# Patient Record
Sex: Male | Born: 1965 | Race: Black or African American | Hispanic: No | Marital: Married | Smoking: Never smoker
Health system: Southern US, Community
[De-identification: ages and names within clinical notes are randomized; demographics above are authoritative.]

## PROBLEM LIST (undated history)

## (undated) DIAGNOSIS — J40 Bronchitis, not specified as acute or chronic: Secondary | ICD-10-CM

## (undated) DIAGNOSIS — F4320 Adjustment disorder, unspecified: Secondary | ICD-10-CM

## (undated) DIAGNOSIS — F419 Anxiety disorder, unspecified: Secondary | ICD-10-CM

## (undated) HISTORY — DX: Anxiety disorder, unspecified: F41.9

## (undated) HISTORY — PX: NO PAST SURGERIES: SHX2092

---

## 2015-10-18 ENCOUNTER — Encounter (HOSPITAL_BASED_OUTPATIENT_CLINIC_OR_DEPARTMENT_OTHER): Payer: Self-pay | Admitting: *Deleted

## 2015-10-18 ENCOUNTER — Emergency Department (HOSPITAL_BASED_OUTPATIENT_CLINIC_OR_DEPARTMENT_OTHER): Payer: No Typology Code available for payment source

## 2015-10-18 ENCOUNTER — Emergency Department (HOSPITAL_BASED_OUTPATIENT_CLINIC_OR_DEPARTMENT_OTHER)
Admission: EM | Admit: 2015-10-18 | Discharge: 2015-10-18 | Disposition: A | Discharge status: Home / Self Care | Payer: No Typology Code available for payment source | Attending: Emergency Medicine | Admitting: Emergency Medicine

## 2015-10-18 DIAGNOSIS — R062 Wheezing: Secondary | ICD-10-CM | POA: Diagnosis not present

## 2015-10-18 DIAGNOSIS — R05 Cough: Secondary | ICD-10-CM | POA: Insufficient documentation

## 2015-10-18 DIAGNOSIS — R5383 Other fatigue: Secondary | ICD-10-CM | POA: Insufficient documentation

## 2015-10-18 DIAGNOSIS — R52 Pain, unspecified: Secondary | ICD-10-CM

## 2015-10-18 DIAGNOSIS — M791 Myalgia: Secondary | ICD-10-CM | POA: Insufficient documentation

## 2015-10-18 DIAGNOSIS — R509 Fever, unspecified: Secondary | ICD-10-CM | POA: Diagnosis not present

## 2015-10-18 DIAGNOSIS — R059 Cough, unspecified: Secondary | ICD-10-CM

## 2015-10-18 MED ORDER — IPRATROPIUM-ALBUTEROL 0.5-2.5 (3) MG/3ML IN SOLN
3.0000 mL | Freq: Once | RESPIRATORY_TRACT | Status: AC
  Administered 2015-10-18: 3 mL via RESPIRATORY_TRACT
  Filled 2015-10-18: qty 3

## 2015-10-18 MED ORDER — BENZONATATE 100 MG PO CAPS
100.0000 mg | ORAL_CAPSULE | Freq: Once | ORAL | Status: AC
  Administered 2015-10-18: 100 mg via ORAL
  Filled 2015-10-18: qty 1

## 2015-10-18 MED ORDER — IBUPROFEN 800 MG PO TABS: 800.0000 mg | ORAL_TABLET | Freq: Three times a day (TID) | ORAL | Status: DC

## 2015-10-18 MED ORDER — BENZONATATE 100 MG PO CAPS: 100.0000 mg | ORAL_CAPSULE | Freq: Three times a day (TID) | ORAL | Status: DC

## 2015-10-18 NOTE — ED Notes (Signed)
Rx x 2 and note for work given. D/c home with ride

## 2015-10-18 NOTE — ED Notes (Signed)
Patient transported to X-ray 

## 2015-10-18 NOTE — ED Notes (Signed)
PA at bedside.

## 2015-10-18 NOTE — Discharge Instructions (Signed)
You have been seen today for cough, congestion, body aches, and fever. Your imaging showed no abnormalities. Your symptoms are consistent with a viral illness. Viruses do not require antibiotics. Treatment is symptomatic care. Drink plenty of fluids and get plenty of rest. Ibuprofen or Tylenol for pain or fever. Tessalon for cough. Plain Mucinex may help relieve congestion. Warm liquids or Chloraseptic spray may help soothe the sore throat. Follow up with PCP as needed if symptoms continue. Return to ED should symptoms worsen.  RESOURCE GUIDE  Chronic Pain Problems: Contact [facility]  [phone] Patients need to be referred by their primary care doctor.  Insufficient Money for Medicine: Contact United Way:  call "211" or [facility] [phone].  No Primary Care Doctor: - Call [facility]  [phone] - can help you locate a primary care doctor that  accepts your insurance, provides certain services, etc. - Physician Referral Service- [phone]  Agencies that provide inexpensive medical care: - [facility]  [phone] - [facility]  [phone] - [facility]  [phone] - Women's Clinic  [phone] - Planned Parenthood  [phone] [facility]  [phone]  Medicaid-accepting [facility] Providers: - [facility]- [address]  [phone], Mon-Fri 9am-7pm, Sat 9am-1pm - [facility]- [address] [place], [address] [place]  [phone] - [facility]- [address] [place]  [phone] [facility]- [address]  [phone] - Renaye Rakers- [address] [place], [address], [phone]  Only accepts [place] patients after they have their name  applied to their card  Self Pay (no insurance) in [place]: - Sickle Cell Patients: Dr Willey Blade, [facility]  [address] [place],  [phone] - [facility]- [address] [place]  [phone]       [facility]- [address]       -     [facility]- see information above (Speak to [employer] if you do not have insurance)       -  [facility]- [address] [place], [phone]       -  [facility]- [address],  [phone]       -  [facility]- [address], [phone]       -  Dr Julio Sicks-  [address], [address], [place], [phone]       -  [facility]- [address] [phone]       -  [facility]- [address] [phone], also [address], [phone]       -    [facility]- [address] [place], [phone], 1st & 3rd Saturday   every month, 10am-1pm  1) Find a Doctor and Pay Out of Pocket Although you won't have to find out who is covered by your insurance plan, it is a good idea to ask around and get recommendations. You will then need to call the office and see if the doctor you have chosen will accept you as a new patient and what types of options they offer for patients who are self-pay. Some doctors offer discounts or will set up payment plans for their patients who do not have insurance, but  you will need to ask so you aren't surprised when you get to your appointment.  2) Contact Your Local Health Department Not all health departments have doctors that can see patients for sick visits, but many do, so it is worth a call to see if yours does. If you don't know where your local health department is, you can check in your phone book. The CDC also has a tool to help you locate your state's health department, and many state websites also have listings of all of their local health departments.  3) Find a Walk-in Clinic If your illness is not likely to be very severe or complicated, you may want to try a walk in clinic. These are popping up all over the country in pharmacies,  drugstores, and shopping centers. They're usually staffed by nurse practitioners or physician assistants that have been trained to treat common illnesses and complaints. They're usually fairly quick and inexpensive. However, if you have serious medical issues or chronic medical problems, these are probably not your best option  STD Testing - [facility] Department of [facility], STD Clinic, [address] [place], phone [phone] or [phone].  Monday - Friday, call for an appointment. [facility] Department of [employer], STD Clinic, [place] [address], [place], phone [phone] or [phone].  Monday - Friday, call for an appointment.  Abuse/Neglect: [facility] Child Abuse Hotline 878-760-1923 [facility] Child Abuse Hotline 561-574-0749 (After Hours)  Emergency Shelter:  [facility] [phone]  Maternity Homes: - [facility] [phone] - [facility] ([phone]  MRSA Hotline #:   651-183-6808  [facility] Resources  [facility] [place]  [facility]. [address]                 [address]         [address] [place] [address]  Blondell Reveal Phone:  [phone]                                  Phone:  [phone]                   Phone:  [phone]  [facility], [phone] - [facility] - [phone]       -     [facility] in [place], [address],                                  [phone], [facility] Child Abuse Hotline 248-542-9832 or 575-173-8646 (After Hours)   Behavioral Health Services  Substance Abuse Resources: - [facility]  [phone] - [facility]  [phone] - [facility] [phone] Floydene Flock [phone] - Residential &  Outpatient Substance Abuse Program  [phone]  Psychological Services: Tressie Ellis- [facility]  [phone] - [facility]  [phone] - [facility], [phone] [place] [address], [place], ACCESS LINE: [phone] or [phone], [email or web address]  Dental Assistance  If unable to pay or uninsured, contact:  [facility] or [facility] to become qualified for the adult dental clinic.  Patients with Medicaid: [facility]  Dental [phone], [phone] [address], [phone]  If unable to pay, or uninsured, contact [facility] [phone]) or [facility] [phone] in [place], [phone] in [facility]) to become qualified for the adult dental clinic   Other Low-Cost Community Dental Services: - [facility]- [address], [place], [place], [phone], [phone], [phone], 2nd and 4th Thursday of the month at 6:30am.  10 clients each day by appointment, can sometimes see walk-in patients if someone does not show for an appointment. [facility]- [address], [place], [place], [phone], [phone] - [facility]- [address], [place], [place], [phone], [phone] - [facility]- [phone] [facility]- [facility]- [phone] [facility]- [facility]- [phone]

## 2015-10-18 NOTE — ED Provider Notes (Signed)
CSN: [id number]     Arrival date & time 10/18/15  0932 History   First MD Initiated Contact with Patient 10/18/15 [phone]     Chief Complaint  Patient presents with  . Cough     (Consider location/radiation/quality/duration/timing/severity/associated sxs/prior Treatment) HPI   Ardelia MemsRichard Wach is a 50 y.o. male, patient with no pertinent past medical history, presenting to the ED with productive cough with green sputum accompanied by fever/chills, body aches, and fatigue since March 15. Pt states he had a headache initially, but denies any current headache. Pt has tried Tylenol Cold & Flu and NyQuil with some relief. Tmax was 102 on the first day of illness. Pt denies N/V/D, abdominal pain, chest pain, shortness of breath, or any other complaints.     History reviewed. No pertinent past medical history. History reviewed. No pertinent past surgical history. No family history on file. Social History  Substance Use Topics  . Smoking status: Never Smoker   . Smokeless tobacco: Never Used  . Alcohol Use: No    Review of Systems  Constitutional: Positive for fever, chills and fatigue.  Respiratory: Positive for cough. Negative for shortness of breath.   Cardiovascular: Negative for chest pain.  Gastrointestinal: Negative for nausea, vomiting, abdominal pain and diarrhea.  Musculoskeletal: Negative for back pain.  Skin: Negative for color change and pallor.  Neurological: Positive for headaches (Resolved). Negative for dizziness and light-headedness.      Allergies  Review of patient's allergies indicates no known allergies.  Home Medications   Prior to Admission medications   Medication Sig Start Date End Date Taking? Authorizing Provider  benzonatate (TESSALON) 100 MG capsule Take 1 capsule (100 mg total) by mouth every 8 (eight) hours. 10/18/15   Shawn C Joy, PA-C  ibuprofen (ADVIL,MOTRIN) 800 MG tablet Take 1 tablet (800 mg total) by mouth 3 (three) times daily. 10/18/15   Shawn C  Joy, PA-C   BP 116/68 mmHg  Pulse 90  Temp(Src) 98.3 F (36.8 C) (Oral)  Resp 16  Ht 5\' 7"  (1.702 m)  Wt 86.183 kg  BMI 29.75 kg/m2  SpO2 95% Physical Exam  Constitutional: He appears well-developed and well-nourished. No distress.  HENT:  Head: Normocephalic and atraumatic.  Eyes: Conjunctivae are normal. Pupils are equal, round, and reactive to light.  Neck: Neck supple.  Cardiovascular: Normal rate, regular rhythm, normal heart sounds and intact distal pulses.   Pulmonary/Chest: Effort normal. No respiratory distress. He has wheezes in the right upper field and the right middle field.  Abdominal: Soft. Bowel sounds are normal. There is no tenderness. There is no guarding.  Musculoskeletal: He exhibits no edema or tenderness.  Lymphadenopathy:    He has no cervical adenopathy.  Neurological: He is alert.  Skin: Skin is warm and dry. He is not diaphoretic.  Psychiatric: He has a normal mood and affect. His behavior is normal.  Nursing note and vitals reviewed.   ED Course  Procedures (including critical care time)  Imaging Review Dg Chest 2 View  10/18/2015  CLINICAL DATA:  Productive cough with fever for 3 days EXAM: CHEST  2 VIEW COMPARISON:  None. FINDINGS: Lungs are clear. Heart size and pulmonary vascularity are normal. No adenopathy. No bone lesions. IMPRESSION: No edema or consolidation. Electronically Signed   By: Bretta BangWilliam  Woodruff III M.D.   On: 10/18/2015 10:29   I have personally reviewed and evaluated these images as part of my medical decision-making.   EKG Interpretation None  MDM   Final diagnoses:  Cough  Fever, unspecified fever cause  Body aches    Ardelia Mems presents with cough, fever, body aches, and fatigue for the last 4 days.  Due to the patient's productive cough and associated wheezing, chest x-ray was obtained. DuoNeb and Tessalon also ordered. Patient is nontoxic appearing, afebrile, not tachycardic, not tachypneic, maintains  SPO2 of 100% on room air, and is in no apparent distress. Patient has no signs of sepsis or other serious or life-threatening condition. No indication for labs at this time. Chest x-ray shows no signs of infiltration. Upon reassessment, patient voices improvement and wheezes have resolved. Suspect viral bronchitis or other upper respiratory infection. The patient was given instructions for home care as well as return precautions. Patient voices understanding of these instructions, accepts the plan, and is comfortable with discharge.   Filed Vitals:   10/18/15 0939 10/18/15 1011 10/18/15 1012 10/18/15 1122  BP: 132/84   116/68  Pulse: 84   90  Temp: 98.3 F (36.8 C)     TempSrc: Oral     Resp: 20   16  Height:  (1.702 m)     Weight: 86.183 kg     SpO2: 98% 100% 100% 95%      Anselm Pancoast, PA-C 10/19/15 0832  Jerelyn Scott, MD 10/19/15 [phone]

## 2015-10-18 NOTE — ED Notes (Signed)
Cough, fever, chills, headache, fatigue since wednesday

## 2016-10-14 ENCOUNTER — Emergency Department (HOSPITAL_BASED_OUTPATIENT_CLINIC_OR_DEPARTMENT_OTHER)
Admission: EM | Admit: 2016-10-14 | Discharge: 2016-10-14 | Disposition: A | Discharge status: Home / Self Care | Payer: No Typology Code available for payment source | Attending: Emergency Medicine | Admitting: Emergency Medicine

## 2016-10-14 ENCOUNTER — Emergency Department (HOSPITAL_BASED_OUTPATIENT_CLINIC_OR_DEPARTMENT_OTHER): Payer: No Typology Code available for payment source

## 2016-10-14 ENCOUNTER — Encounter (HOSPITAL_BASED_OUTPATIENT_CLINIC_OR_DEPARTMENT_OTHER): Payer: Self-pay

## 2016-10-14 DIAGNOSIS — J40 Bronchitis, not specified as acute or chronic: Secondary | ICD-10-CM | POA: Diagnosis not present

## 2016-10-14 DIAGNOSIS — R05 Cough: Secondary | ICD-10-CM | POA: Diagnosis present

## 2016-10-14 HISTORY — DX: Bronchitis, not specified as acute or chronic: J40

## 2016-10-14 MED ORDER — ALBUTEROL SULFATE HFA 108 (90 BASE) MCG/ACT IN AERS
2.0000 | INHALATION_SPRAY | Freq: Once | RESPIRATORY_TRACT | Status: AC
  Administered 2016-10-14: 2 via RESPIRATORY_TRACT
  Filled 2016-10-14: qty 6.7

## 2016-10-14 MED ORDER — BENZONATATE 100 MG PO CAPS: 100.0000 mg | ORAL_CAPSULE | Freq: Three times a day (TID) | ORAL | 0 refills | Status: DC | PRN

## 2016-10-14 MED ORDER — PREDNISONE 20 MG PO TABS: ORAL_TABLET | ORAL | 0 refills | Status: DC

## 2016-10-14 MED ORDER — PREDNISONE 20 MG PO TABS
40.0000 mg | ORAL_TABLET | Freq: Once | ORAL | Status: AC
  Administered 2016-10-14: 40 mg via ORAL
  Filled 2016-10-14: qty 2

## 2016-10-14 MED ORDER — IPRATROPIUM-ALBUTEROL 0.5-2.5 (3) MG/3ML IN SOLN
3.0000 mL | Freq: Once | RESPIRATORY_TRACT | Status: AC
  Administered 2016-10-14: 3 mL via RESPIRATORY_TRACT
  Filled 2016-10-14: qty 3

## 2016-10-14 MED FILL — BENZONATATE 100 MG CAP: 100 | 7 days supply | Qty: 21 | Fill #0

## 2016-10-14 MED FILL — predniSONE 20 MG TABS: 20 | 4 days supply | Qty: 8 | Fill #0

## 2016-10-14 NOTE — ED Triage Notes (Signed)
c/o cough, wheezing x 1-2 months-NAD-steady gait

## 2016-10-14 NOTE — ED Provider Notes (Signed)
[facility] Provider Note   CSN: [id number] Arrival date & time: 10/14/16  1205     History   Chief Complaint Chief Complaint  Patient presents with  . Cough    HPI Ardelia MemsRichard Winiarski is a 51 y.o. male.  HPI   Pt p/w 1-2 months of cough productive of yellow sputum and wheezing.  Wheezing is worse at night.  States this occurred last year while the weather was cold and improved in springtime.  Came to ED because he is about to go on a trip for his birthday and his wife wants him to stop wheezing at night.   Is taking no medication for his symptoms.  Pt denies hx asthma or smoking.  Denies chest pain, hemoptysis.  Denies recent immobilization, leg swelling, family history of blood clots, personal hx cancer.     Past Medical History:  Diagnosis Date  . Bronchitis     There are no active problems to display for this patient.   History reviewed. No pertinent surgical history.     Home Medications    Prior to Admission medications   Medication Sig Start Date End Date Taking? Authorizing Provider  benzonatate (TESSALON) 100 MG capsule Take 1 capsule (100 mg total) by mouth 3 (three) times daily as needed for cough. 10/14/16   Trixie DredgeEmily Lian Pounds, PA-C  predniSONE (DELTASONE) 20 MG tablet 2 tabs po daily x 4 days 10/14/16   Trixie DredgeEmily Allee Busk, PA-C    Family History No family history on file.  Social History Social History  Substance Use Topics  . Smoking status: Never Smoker  . Smokeless tobacco: Never Used  . Alcohol use No     Allergies   Patient has no known allergies.   Review of Systems Review of Systems  All other systems reviewed and are negative.    Physical Exam Updated Vital Signs BP 135/86 (BP Location: Left Arm)   Pulse 87   Temp 98.6 F (37 C) (Oral)   Resp 18   Ht 5\' 7"  (1.702 m)   Wt 95.5 kg   SpO2 99%   BMI 32.98 kg/m   Physical Exam  Constitutional: He appears well-developed and well-nourished. No distress.  HENT:  Head: Normocephalic  and atraumatic.  Mouth/Throat: Oropharynx is clear and moist and mucous membranes are normal. No oropharyngeal exudate, posterior oropharyngeal edema, posterior oropharyngeal erythema or tonsillar abscesses.  Eyes: Conjunctivae and EOM are normal. Right eye exhibits no discharge. Left eye exhibits no discharge.  Neck: Normal range of motion. Neck supple.  Cardiovascular: Normal rate and regular rhythm.   Pulmonary/Chest: Effort normal. No stridor. No respiratory distress. He has decreased breath sounds. He has wheezes. He has no rales.  Coarse breath sounds, coughing   Lymphadenopathy:    He has no cervical adenopathy.  Neurological: He is alert.  Skin: He is not diaphoretic.  Nursing note and vitals reviewed.    ED Treatments / Results  Labs (all labs ordered are listed, but only abnormal results are displayed) Labs Reviewed - No data to display  EKG  EKG Interpretation None       Radiology Dg Chest 2 View  Result Date: 10/14/2016 CLINICAL DATA:  Chest congestion. EXAM: CHEST  2 VIEW COMPARISON:  10/18/2015. FINDINGS: Mediastinum and hilar structures are normal. Lungs are clear. No pleural effusion or pneumothorax. Heart size normal. Degenerative changes thoracic spine. IMPRESSION: No acute cardiopulmonary disease. Electronically Signed   By: Maisie Fushomas  Register   On: 10/14/2016 12:58  Procedures Procedures (including critical care time)  Medications Ordered in ED Medications  albuterol (PROVENTIL HFA;VENTOLIN HFA) 108 (90 Base) MCG/ACT inhaler 2 puff (not administered)  ipratropium-albuterol (DUONEB) 0.5-2.5 (3) MG/3ML nebulizer solution 3 mL (3 mLs Nebulization Given 10/14/16 1254)  predniSONE (DELTASONE) tablet 40 mg (40 mg Oral Given 10/14/16 1248)     Initial Impression / Assessment and Plan / ED Course  I have reviewed the triage vital signs and the nursing notes.  Pertinent labs & imaging results that were available during my care of the patient were reviewed by me  and considered in my medical decision making (see chart for details).  Clinical Course as of Oct 15 1326  Thu Oct 14, 2016  1321 Pt reports great improvement following neb treatment, moving air well in all fields but with continued occasional wheezing, declines further nebs.    [EW]    Clinical Course User Index [EW] Trixie Dredge, PA-C   Afebrile, nontoxic patient with wheezing, particularly at night, and productive cough x 1-2 months.  Similar symptoms last year at this time.  No hx asthma or smoking.  Improved with nebs.  No risk factors for PE. Doubt ACS of CHF.  CXR clear.   D/C home with prednisone, albuterol, tessalon perles, PCP follow up.   Discussed result, findings, treatment, and follow up  with patient.  Pt given return precautions.  Pt verbalizes understanding and agrees with plan.       Final Clinical Impressions(s) / ED Diagnoses   Final diagnoses:  Bronchitis    New Prescriptions New Prescriptions   BENZONATATE (TESSALON) 100 MG CAPSULE    Take 1 capsule (100 mg total) by mouth 3 (three) times daily as needed for cough.   PREDNISONE (DELTASONE) 20 MG TABLET    2 tabs po daily x 4 days     Trixie Dredge, PA-C 10/14/16 1328    Rolan Bucco, MD 10/14/16 1404

## 2016-10-14 NOTE — Discharge Instructions (Signed)
Read the information below.  Use the prescribed medication as directed.  Please discuss all new medications with your pharmacist.  You may return to the Emergency Department at any time for worsening condition or any new symptoms that concern you.   If you develop worsening shortness of breath, uncontrolled wheezing, severe chest pain, or fevers despite using tylenol and/or ibuprofen, return for a recheck.     °

## 2016-12-22 ENCOUNTER — Encounter (HOSPITAL_BASED_OUTPATIENT_CLINIC_OR_DEPARTMENT_OTHER): Payer: Self-pay

## 2016-12-22 ENCOUNTER — Emergency Department (HOSPITAL_BASED_OUTPATIENT_CLINIC_OR_DEPARTMENT_OTHER): Payer: No Typology Code available for payment source

## 2016-12-22 ENCOUNTER — Emergency Department (HOSPITAL_BASED_OUTPATIENT_CLINIC_OR_DEPARTMENT_OTHER)
Admission: EM | Admit: 2016-12-22 | Discharge: 2016-12-22 | Disposition: A | Discharge status: Home / Self Care | Payer: No Typology Code available for payment source | Attending: Physician Assistant | Admitting: Physician Assistant

## 2016-12-22 DIAGNOSIS — R079 Chest pain, unspecified: Secondary | ICD-10-CM | POA: Diagnosis not present

## 2016-12-22 DIAGNOSIS — J40 Bronchitis, not specified as acute or chronic: Secondary | ICD-10-CM | POA: Insufficient documentation

## 2016-12-22 DIAGNOSIS — R0789 Other chest pain: Secondary | ICD-10-CM

## 2016-12-22 DIAGNOSIS — Z79899 Other long term (current) drug therapy: Secondary | ICD-10-CM | POA: Diagnosis not present

## 2016-12-22 DIAGNOSIS — Z7982 Long term (current) use of aspirin: Secondary | ICD-10-CM | POA: Diagnosis not present

## 2016-12-22 LAB — CBC WITH DIFFERENTIAL/PLATELET
BASOS PCT: 0 %
Basophils Absolute: 0 10*3/uL (ref 0.0–0.1)
Eosinophils Absolute: 0 10*3/uL (ref 0.0–0.7)
Eosinophils Relative: 1 %
HEMATOCRIT: 41.2 % (ref 39.0–52.0)
Hemoglobin: 14.4 g/dL (ref 13.0–17.0)
Lymphocytes Relative: 29 %
Lymphs Abs: 2.3 10*3/uL (ref 0.7–4.0)
MCH: 30.9 pg (ref 26.0–34.0)
MCHC: 35 g/dL (ref 30.0–36.0)
MCV: 88.4 fL (ref 78.0–100.0)
Monocytes Absolute: 0.4 10*3/uL (ref 0.1–1.0)
Monocytes Relative: 5 %
NEUTROS ABS: 5.2 10*3/uL (ref 1.7–7.7)
NEUTROS PCT: 65 %
PLATELETS: 213 10*3/uL (ref 150–400)
RBC: 4.66 MIL/uL (ref 4.22–5.81)
RDW: 12.6 % (ref 11.5–15.5)
WBC: 8.1 10*3/uL (ref 4.0–10.5)

## 2016-12-22 LAB — COMPREHENSIVE METABOLIC PANEL
ALT: 24 U/L (ref 17–63)
ANION GAP: 10 (ref 5–15)
AST: 27 U/L (ref 15–41)
Albumin: 4.5 g/dL (ref 3.5–5.0)
Alkaline Phosphatase: 72 U/L (ref 38–126)
BUN: 12 mg/dL (ref 6–20)
CHLORIDE: 106 mmol/L (ref 101–111)
CO2: 22 mmol/L (ref 22–32)
Calcium: 9.5 mg/dL (ref 8.9–10.3)
Creatinine, Ser: 1.05 mg/dL (ref 0.61–1.24)
Glucose, Bld: 118 mg/dL — ABNORMAL HIGH (ref 65–99)
POTASSIUM: 3.6 mmol/L (ref 3.5–5.1)
Sodium: 138 mmol/L (ref 135–145)
Total Bilirubin: 0.4 mg/dL (ref 0.3–1.2)
Total Protein: 7.8 g/dL (ref 6.5–8.1)

## 2016-12-22 LAB — TROPONIN I: Troponin I: <0.03 ng/mL (ref <0.03)

## 2016-12-22 MED ORDER — ASPIRIN 81 MG PO CHEW
324.0000 mg | CHEWABLE_TABLET | Freq: Once | ORAL | Status: AC
  Administered 2016-12-22: 324 mg via ORAL
  Filled 2016-12-22: qty 4

## 2016-12-22 NOTE — ED Provider Notes (Signed)
[facility] Provider Note   CSN: [id number] Arrival date & time: 12/22/16  1640     History   Chief Complaint Chief Complaint  Patient presents with  . Chest Pain    HPI Samuel Munoz is a 51 y.o. male.  HPI   Patient is a 51 year old male with no history of hypertension hyperlipidemia or diabetes nonsmoker no family history of early cardiac death. Presenting today with chest pain. Reports his mom went on for a number of years. 7 years ago he saw a cardiologist who did an echo and it was normal. Patient says that happens occasionally when he showering, or at work. He describes the feeling as feeling "heart weak". It is not a true pain or tightness. He just feels weak. Usually resolves in 5-30 minutes. No pain does not travel down the arm or into neck. No diaphoresis or shortness of breath.  Past Medical History:  Diagnosis Date  . Bronchitis     There are no active problems to display for this patient.   History reviewed. No pertinent surgical history.     Home Medications    Prior to Admission medications   Not on File    Family History No family history on file.  Social History Social History  Substance Use Topics  . Smoking status: Never Smoker  . Smokeless tobacco: Never Used  . Alcohol use No     Allergies   Patient has no known allergies.   Review of Systems Review of Systems  Constitutional: Negative for activity change.  Respiratory: Negative for shortness of breath.   Cardiovascular: Negative for chest pain.  Gastrointestinal: Negative for abdominal pain.     Physical Exam Updated Vital Signs BP 132/79 (BP Location: Right Arm)   Pulse 76   Temp 98.5 F (36.9 C) (Oral)   Resp 16   Ht 5\' 8"  (1.727 m)   Wt 95.3 kg (210 lb)   SpO2 100%   BMI 31.93 kg/m   Physical Exam  Constitutional: He is oriented to person, place, and time. He appears well-nourished.  HENT:  Head: Normocephalic.  Eyes: Conjunctivae are normal.    Cardiovascular: Normal rate and regular rhythm.   No murmur heard. Pulmonary/Chest: Effort normal and breath sounds normal. No respiratory distress. He has no wheezes.  Abdominal: Soft. He exhibits no distension. There is no tenderness.  Musculoskeletal: He exhibits no edema.  Neurological: He is oriented to person, place, and time.  Skin: Skin is warm and dry. He is not diaphoretic.  Psychiatric: He has a normal mood and affect. His behavior is normal.     ED Treatments / Results  Labs (all labs ordered are listed, but only abnormal results are displayed) Labs Reviewed  COMPREHENSIVE METABOLIC PANEL - Abnormal; Notable for the following:       Result Value   Glucose, Bld 118 (*)    All other components within normal limits  TROPONIN I  CBC WITH DIFFERENTIAL/PLATELET  TROPONIN I  CBC WITH DIFFERENTIAL/PLATELET    EKG  EKG Interpretation  Date/Time:  Wednesday Dec 22 2016 16:47:49 EDT Ventricular Rate:  75 PR Interval:  210 QRS Duration: 86 QT Interval:  348 QTC Calculation: 388 R Axis:   58 Text Interpretation:  Normal sinus rhythm Confirmed by Corlis LeakMackuen, Courteney ([id number]) on 12/22/2016 4:54:00 PM       Radiology Dg Chest 2 View  Result Date: 12/22/2016 CLINICAL DATA:  Chest pain EXAM: CHEST  2 VIEW COMPARISON:  10/14/2016 FINDINGS: Normal  heart size, mediastinal contours, and pulmonary vascularity. Mild central peribronchial thickening. No pulmonary infiltrate, pleural effusion, or pneumothorax. Bones unremarkable. IMPRESSION: Mild bronchitic changes without infiltrate. Electronically Signed   By: Ulyses Southward M.D.   On: 12/22/2016 17:39    Procedures Procedures (including critical care time)  Medications Ordered in ED Medications  aspirin chewable tablet 324 mg (324 mg Oral Given 12/22/16 1736)     Initial Impression / Assessment and Plan / ED Course  I have reviewed the triage vital signs and the nursing notes.  Pertinent labs & imaging results that were  available during my care of the patient were reviewed by me and considered in my medical decision making (see chart for details).     Well-appearing 51 year old gentleman with no cardiac risk factors presenting with "heart weak". Heart scores 3. We'll do delta troponin. Do not suspect PE, dissection. This could represent spectrum of angina, so will have patient follow-up with cardiology as an outpatient.  Delta trop negative.    Final Clinical Impressions(s) / ED Diagnoses   Final diagnoses:  None    New Prescriptions New Prescriptions   No medications on file     Abelino Derrick, MD 12/22/16 2136

## 2016-12-22 NOTE — ED Notes (Signed)
Pt on monitor 

## 2016-12-22 NOTE — ED Triage Notes (Signed)
CP x today-NAD-steady gait 

## 2016-12-22 NOTE — Discharge Instructions (Signed)
We're unsure what caused your symptoms. However we reassured by your labs x-ray.We want you to follow up with a cardiologist however for outpatient workup.

## 2017-01-12 ENCOUNTER — Ambulatory Visit (INDEPENDENT_AMBULATORY_CARE_PROVIDER_SITE_OTHER): Payer: No Typology Code available for payment source | Admitting: Cardiovascular Disease

## 2017-01-12 ENCOUNTER — Encounter: Payer: Self-pay | Admitting: Cardiovascular Disease

## 2017-01-12 VITALS — BP 116/74 | HR 74 | Ht 68.0 in | Wt 204.6 lb

## 2017-01-12 DIAGNOSIS — R0789 Other chest pain: Secondary | ICD-10-CM | POA: Diagnosis not present

## 2017-01-12 HISTORY — DX: Other chest pain: R07.89

## 2017-01-12 NOTE — Assessment & Plan Note (Signed)
Samuel Munoz is a 51 year old mildly overweight married African-American male referred by the emergency room for atypical chest pain. 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 He has no cardiac risk factors. He hasn't episode of atypical chest pain which brought him to the [facility] on 5/23/HCT 18. His workup was unremarkable. A week or so later he went [facility] with similar symptoms. He ruled out for myocardial infarction. 2-D echo was normal. I think that given the fact that he's had 2 ER visits for chest pain we will obtain a exercise Myoview stress test to risk stratify him.

## 2017-01-12 NOTE — Progress Notes (Signed)
     01/12/2017 Samuel Munoz   [birth year]  [id number]  Primary Physician Patient, No Pcp Per Primary Cardiologist: Runell GessJonathan J Berry MD Roseanne RenoFACP, FACC, FAHA, FSCAI  HPI:  [name] is a 51 year old mildly overweight married African-American male father of 4, grandfather of one grandchild accompanied by his wife Samuel Munoz. 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 He was referred by [facility] on 12/22/16 when he presented with atypical chest pain. He has no cardiac risk factors. He presented again a week or so later [facility] where he was admitted and ruled out. A 2-D echo was normal.   No current outpatient prescriptions on file.   No current facility-administered medications for this visit.     No Known Allergies  Social History   Social History  . Marital status: Married    Spouse name: N/A  . Number of children: N/A  . Years of education: N/A   Occupational History  . Not on file.   Social History Main Topics  . Smoking status: Never Smoker  . Smokeless tobacco: Never Used  . Alcohol use No  . Drug use: No  . Sexual activity: Not on file   Other Topics Concern  . Not on file   Social History Narrative  . No narrative on file     Review of Systems: General: negative for chills, fever, night sweats or weight changes.  Cardiovascular: negative for chest pain, dyspnea on exertion, edema, orthopnea, palpitations, paroxysmal nocturnal dyspnea or shortness of breath Dermatological: negative for rash Respiratory: negative for cough or wheezing Urologic: negative for hematuria Abdominal: negative for nausea, vomiting, diarrhea, bright red blood per rectum, melena, or hematemesis Neurologic: negative for visual changes, syncope, or dizziness All other systems reviewed and are otherwise negative except as noted above.    Blood pressure 116/74, pulse 74, height 5\' 8"  (1.727 m), weight 204 lb 9.6 oz  (92.8 kg).  General appearance: alert and no distress Neck: no adenopathy, no carotid bruit, no JVD, supple, symmetrical, trachea midline and thyroid not enlarged, symmetric, no tenderness/mass/nodules Lungs: clear to auscultation bilaterally Heart: regular rate and rhythm, S1, S2 normal, no murmur, click, rub or gallop Extremities: extremities normal, atraumatic, no cyanosis or edema  EKG sinus rhythm at 74 without ST or T-wave changes. I personally reviewed this EKG.  ASSESSMENT AND PLAN:   Atypical chest pain Mr. Samuel Munoz is a 51 year old mildly overweight married African-American male referred by the emergency room for atypical chest pain. 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 He has no cardiac risk factors. He hasn't episode of atypical chest pain which brought him to the [facility] on 5/23/HCT 18. His workup was unremarkable. [facility] week or so [facility]. He ruled out for myocardial infarction. 2-D echo was normal. I think that given the fact that he's had 2 ER visits for chest pain we will obtain a exercise Myoview stress test to risk stratify him.      Runell GessJonathan J. Berry MD FACP,FACC,FAHA, [facility]SCAI 01/12/2017 3:02 PM

## 2017-01-12 NOTE — Patient Instructions (Signed)
Medication Instructions: Your physician recommends that you continue on your current medications as directed. Please refer to the Current Medication list given to you today.   Testing/Procedures: Your physician has requested that you have an exercise stress myoview. For further information please visit [email or web address]. Please follow instruction sheet, as given.  Follow-Up: Your physician recommends that you schedule a follow-up appointment as needed with Dr. Berry.   

## 2017-01-18 ENCOUNTER — Telehealth (HOSPITAL_COMMUNITY): Payer: Self-pay

## 2017-01-18 NOTE — Telephone Encounter (Signed)
Encounter complete. 

## 2017-01-25 ENCOUNTER — Ambulatory Visit (HOSPITAL_COMMUNITY)
Admission: RE | Admit: 2017-01-25 | Discharge: 2017-01-25 | Disposition: A | Discharge status: Home / Self Care | Payer: No Typology Code available for payment source | Source: Ambulatory Visit | Attending: Cardiovascular Disease | Admitting: Cardiovascular Disease

## 2017-01-25 DIAGNOSIS — R0789 Other chest pain: Secondary | ICD-10-CM

## 2017-01-25 LAB — MYOCARDIAL PERFUSION IMAGING
CHL CUP MPHR: 168 {beats}/min
CHL CUP NUCLEAR SDS: 4
CHL RATE OF PERCEIVED EXERTION: 18
Estimated workload: 8.3 METS
Exercise duration (min): 8 min
Exercise duration (sec): 10 s
LV dias vol: 121 mL (ref 62–150)
LVSYSVOL: 41 mL
NUC STRESS TID: 1.18
Peak HR: 155 {beats}/min
Percent HR: 92 %
Rest HR: 70 {beats}/min
SRS: 0
SSS: 4

## 2017-01-25 MED ORDER — TECHNETIUM TC 99M TETROFOSMIN IV KIT
30.8000 | PACK | Freq: Once | INTRAVENOUS | Status: AC | PRN
  Administered 2017-01-25: 30.8 via INTRAVENOUS
  Filled 2017-01-25: qty 31

## 2017-01-25 MED ORDER — TECHNETIUM TC 99M TETROFOSMIN IV KIT
10.3000 | PACK | Freq: Once | INTRAVENOUS | Status: AC | PRN
  Administered 2017-01-25: 10.3 via INTRAVENOUS
  Filled 2017-01-25: qty 11

## 2017-01-28 ENCOUNTER — Telehealth: Payer: Self-pay | Admitting: Cardiovascular Disease

## 2017-01-28 NOTE — Telephone Encounter (Signed)
Returned the call back to the patient. Results given and patient verbalized his understanding.  Notes recorded by Runell GessBerry, Jonathan J, MD on 01/25/2017 at 4:37 PM EDT Essentially normal study. Repeat when clinically indicated.

## 2017-01-28 NOTE — Telephone Encounter (Signed)
F/u message  Pt returning call about test results. Please call back to discuss

## 2017-02-24 ENCOUNTER — Emergency Department (HOSPITAL_BASED_OUTPATIENT_CLINIC_OR_DEPARTMENT_OTHER)
Admission: EM | Admit: 2017-02-24 | Discharge: 2017-02-24 | Disposition: A | Discharge status: Home / Self Care | Payer: No Typology Code available for payment source | Attending: Emergency Medicine | Admitting: Emergency Medicine

## 2017-02-24 ENCOUNTER — Encounter (HOSPITAL_BASED_OUTPATIENT_CLINIC_OR_DEPARTMENT_OTHER): Payer: Self-pay | Admitting: *Deleted

## 2017-02-24 DIAGNOSIS — R5383 Other fatigue: Secondary | ICD-10-CM | POA: Insufficient documentation

## 2017-02-24 DIAGNOSIS — F419 Anxiety disorder, unspecified: Secondary | ICD-10-CM | POA: Diagnosis not present

## 2017-02-24 DIAGNOSIS — R0602 Shortness of breath: Secondary | ICD-10-CM | POA: Diagnosis not present

## 2017-02-24 DIAGNOSIS — R450 Nervousness: Secondary | ICD-10-CM | POA: Diagnosis present

## 2017-02-24 DIAGNOSIS — R002 Palpitations: Secondary | ICD-10-CM | POA: Diagnosis not present

## 2017-02-24 HISTORY — DX: Adjustment disorder, unspecified: F43.20

## 2017-02-24 MED ORDER — HYDROXYZINE HCL 25 MG PO TABS: 25.0000 mg | ORAL_TABLET | Freq: Four times a day (QID) | ORAL | 0 refills | Status: DC | PRN

## 2017-02-24 NOTE — ED Provider Notes (Signed)
[facility] Note   CSN: [id number] Arrival date & time: 02/24/17  1817  By signing my name below, I, Samuel Munoz, attest that this documentation has been prepared under the direction and in the presence of Samuel Munoz, Samuel Kalla, MD. Electronically Signed: Diona BrownerJennifer Munoz, ED Scribe. 02/24/17. 8:33 PM.  History   Chief Complaint Chief Complaint  Patient presents with  . Anxiety    HPI Samuel Munoz is a 51 y.o. male who presents to the Emergency Department complaining of feeling anxious for the last few weeks, worsening tonight after work. Associated sx include fatigue, SOB, palpitations.  Pt notes his neck and jaw occasionally feel "funny" and describes it as tightness. Sx are similar to when he feels anxious. He recently did a stress test with negative results. Pt reports he can only sleep sitting up because when he lies down he feels uncomfortable. No PMHx. Pt has been seen for similar sx and was told it might be anxiety, however, nothing was done after that. Pt denies fever, diaphoresis, bilateral leg edema and weight loss.   The history is provided by the patient and the spouse. No language interpreter was used.    Past Medical History:  Diagnosis Date  . Adjustment disorder   . Bronchitis     Patient Active Problem List   Diagnosis Date Noted  . Atypical chest pain 01/12/2017    History reviewed. No pertinent surgical history.     Home Medications    Prior to Admission medications   Medication Sig Start Date End Date Taking? Authorizing Provider  hydrOXYzine (ATARAX/VISTARIL) 25 MG tablet Take 1 tablet (25 mg total) by mouth every 6 (six) hours as needed for anxiety. 02/24/17   Samuel Munoz, Samuel Usrey, MD    Family History History reviewed. No pertinent family history.  Social History Social History  Substance Use Topics  . Smoking status: Never Smoker  . Smokeless tobacco: Never Used  . Alcohol use No     Allergies   Patient has no known  allergies.   Review of Systems Review of Systems  Constitutional: Positive for fatigue. Negative for diaphoresis and fever.  Respiratory: Positive for shortness of breath.   Cardiovascular: Positive for palpitations. Negative for leg swelling.  Psychiatric/Behavioral: Positive for sleep disturbance. The patient is nervous/anxious.   All other systems reviewed and are negative.    Physical Exam Updated Vital Signs BP 136/83   Pulse 69   Temp 98 F (36.7 C) (Oral)   Resp 16   Ht 5\' 7"  (1.702 m)   Wt 90.7 kg (200 lb)   SpO2 100%   BMI 31.32 kg/m   Physical Exam  Constitutional: He is oriented to person, place, and time. He appears well-developed and well-nourished.  Anxious.  HENT:  Head: Normocephalic and atraumatic.  Cardiovascular: Normal rate and regular rhythm.   No murmur heard. Pulmonary/Chest: Effort normal and breath sounds normal. No respiratory distress.  Abdominal: Soft. There is no tenderness. There is no rebound and no guarding.  Musculoskeletal: He exhibits no edema or tenderness.  Neurological: He is alert and oriented to person, place, and time.  Skin: Skin is warm and dry.  Psychiatric: He has a normal mood and affect. His behavior is normal.  Nursing note and vitals reviewed.    ED Treatments / Results  DIAGNOSTIC STUDIES: Oxygen Saturation is 100% on RA, normal by my interpretation.   COORDINATION OF CARE: 8:33 PM-Discussed next steps with pt which includes a physiatrist or therapist. Pt verbalized understanding  and is agreeable with the plan.   Labs (all labs ordered are listed, but only abnormal results are displayed) Labs Reviewed - No data to display  EKG  EKG Interpretation None       Radiology No results found.  Procedures Procedures (including critical care time)  Medications Ordered in ED Medications - No data to display   Initial Impression / Assessment and Plan / ED Course  I have reviewed the triage vital signs and  the nursing notes.  Pertinent labs & imaging results that were available during my care of the patient were reviewed by me and considered in my medical decision making (see chart for details).     Patient here for evaluation of anxiety with intermittent shortness of breath and jaw/anxiety feeling when laying supine. He recently had admission with extensive workup with labs, stress testing. Current clinical picture is not consistent with ACS, CHF, pneumonia, PE, obstructive respiratory lesion. He is anxious on examination with no SI, HI, paranoia, hallucinations. Counseled patient on likely anxiety and importance of outpatient follow-up for further evaluation. Providing prescription for hydroxyzine.   Final Clinical Impressions(s) / ED Diagnoses   Final diagnoses:  Anxiety    New Prescriptions Discharge Medication List as of 02/24/2017  9:04 PM    START taking these medications   Details  hydrOXYzine (ATARAX/VISTARIL) 25 MG tablet Take 1 tablet (25 mg total) by mouth every 6 (six) hours as needed for anxiety., Starting Thu 02/24/2017, Print       I personally performed the services described in this documentation, which was scribed in my presence. The recorded information has been reviewed and is accurate.    Samuel Munoz, Samuel Hawes, MD 02/25/17 1316

## 2017-02-24 NOTE — ED Triage Notes (Addendum)
Pt c/o " anxiety"  Anxious , fullness in throat  X 6 hrs.  Recent DX TIA with neg stress last month.

## 2017-02-24 NOTE — ED Notes (Signed)
Pt reports feelings of anxiety x several months. Pt reports feeling his body being tense all over especially when laying down. Pt states symptoms seem to be exacerbated after work. Pt had a full cardiac work up including normal echo for these symptoms. Pt states he has talked to an Holiday representativeanxiety counselor. Pt states he has not been prescribed any anxiety medications.

## 2017-03-02 ENCOUNTER — Ambulatory Visit: Payer: No Typology Code available for payment source | Admitting: Family Medicine

## 2017-03-02 DIAGNOSIS — Z0289 Encounter for other administrative examinations: Secondary | ICD-10-CM

## 2017-03-03 ENCOUNTER — Telehealth: Payer: Self-pay

## 2017-03-03 NOTE — Telephone Encounter (Signed)
Pre visit call completed 

## 2017-03-04 ENCOUNTER — Telehealth: Payer: Self-pay | Admitting: Medical

## 2017-03-04 ENCOUNTER — Ambulatory Visit (HOSPITAL_BASED_OUTPATIENT_CLINIC_OR_DEPARTMENT_OTHER)
Admission: RE | Admit: 2017-03-04 | Discharge: 2017-03-04 | Disposition: A | Discharge status: Home / Self Care | Payer: No Typology Code available for payment source | Source: Ambulatory Visit | Attending: Medical | Admitting: Medical

## 2017-03-04 ENCOUNTER — Encounter: Payer: Self-pay | Admitting: Medical

## 2017-03-04 ENCOUNTER — Ambulatory Visit (INDEPENDENT_AMBULATORY_CARE_PROVIDER_SITE_OTHER): Payer: No Typology Code available for payment source | Admitting: Medical

## 2017-03-04 VITALS — BP 121/59 | HR 74 | Temp 98.0°F | Resp 16 | Ht 67.0 in | Wt 206.0 lb

## 2017-03-04 DIAGNOSIS — R0789 Other chest pain: Secondary | ICD-10-CM

## 2017-03-04 DIAGNOSIS — M940 Chondrocostal junction syndrome [Tietze]: Secondary | ICD-10-CM

## 2017-03-04 DIAGNOSIS — R002 Palpitations: Secondary | ICD-10-CM | POA: Diagnosis not present

## 2017-03-04 DIAGNOSIS — R5383 Other fatigue: Secondary | ICD-10-CM | POA: Insufficient documentation

## 2017-03-04 DIAGNOSIS — R946 Abnormal results of thyroid function studies: Secondary | ICD-10-CM

## 2017-03-04 DIAGNOSIS — R7989 Other specified abnormal findings of blood chemistry: Secondary | ICD-10-CM

## 2017-03-04 DIAGNOSIS — R739 Hyperglycemia, unspecified: Secondary | ICD-10-CM

## 2017-03-04 DIAGNOSIS — F419 Anxiety disorder, unspecified: Secondary | ICD-10-CM

## 2017-03-04 LAB — CBC WITH DIFFERENTIAL/PLATELET
BASOS ABS: 0 10*3/uL (ref 0.0–0.1)
Basophils Relative: 0.3 % (ref 0.0–3.0)
EOS ABS: 0.1 10*3/uL (ref 0.0–0.7)
Eosinophils Relative: 0.9 % (ref 0.0–5.0)
HCT: 41.8 % (ref 39.0–52.0)
Hemoglobin: 14 g/dL (ref 13.0–17.0)
Lymphocytes Relative: 34.1 % (ref 12.0–46.0)
Lymphs Abs: 2 10*3/uL (ref 0.7–4.0)
MCHC: 33.6 g/dL (ref 30.0–36.0)
MCV: 92.1 fl (ref 78.0–100.0)
MONO ABS: 0.3 10*3/uL (ref 0.1–1.0)
Monocytes Relative: 5.3 % (ref 3.0–12.0)
NEUTROS PCT: 59.4 % (ref 43.0–77.0)
Neutro Abs: 3.5 10*3/uL (ref 1.4–7.7)
Platelets: 246 10*3/uL (ref 150.0–400.0)
RBC: 4.54 Mil/uL (ref 4.22–5.81)
RDW: 13.5 % (ref 11.5–15.5)
WBC: 5.9 10*3/uL (ref 4.0–10.5)

## 2017-03-04 LAB — COMPREHENSIVE METABOLIC PANEL
ALK PHOS: 71 U/L (ref 39–117)
ALT: 19 U/L (ref 0–53)
AST: 15 U/L (ref 0–37)
Albumin: 4.5 g/dL (ref 3.5–5.2)
BILIRUBIN TOTAL: 0.4 mg/dL (ref 0.2–1.2)
BUN: 11 mg/dL (ref 6–23)
CO2: 25 mEq/L (ref 19–32)
CREATININE: 0.98 mg/dL (ref 0.40–1.50)
Calcium: 9.7 mg/dL (ref 8.4–10.5)
Chloride: 104 mEq/L (ref 96–112)
GFR: 103.14 mL/min (ref >60.00)
GLUCOSE: 113 mg/dL — AB (ref 70–99)
Potassium: 3.9 mEq/L (ref 3.5–5.1)
SODIUM: 141 meq/L (ref 135–145)
TOTAL PROTEIN: 7.8 g/dL (ref 6.0–8.3)

## 2017-03-04 LAB — TROPONIN I: TNIDX: 0 ug/L (ref 0.00–0.06)

## 2017-03-04 LAB — T4, FREE: Free T4: 0.82 ng/dL (ref 0.60–1.60)

## 2017-03-04 LAB — TSH: TSH: 0.82 u[IU]/mL (ref 0.35–4.50)

## 2017-03-04 MED ORDER — DICLOFENAC SODIUM 75 MG PO TBEC: 75.0000 mg | DELAYED_RELEASE_TABLET | Freq: Two times a day (BID) | ORAL | 0 refills | Status: DC

## 2017-03-04 MED ORDER — CLONAZEPAM 0.5 MG PO TABS: 0.5000 mg | ORAL_TABLET | Freq: Two times a day (BID) | ORAL | 0 refills | Status: DC | PRN

## 2017-03-04 NOTE — Telephone Encounter (Signed)
Future tsh and t4 placed. 

## 2017-03-04 NOTE — Progress Notes (Signed)
Subjective:    Patient ID: Samuel Munoz, male    DOB: 01/14/1966, 51 y.o.   MRN: [id number]  HPI  Pt here for first time today.  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 Pt does not exercise regularly. Occasionally walks around track with kids, drinks 1-2 pepsi a day. Mostly one a day. Pt states eats moderate healthy. Eats some fruits but more vegetables. He tries to avoid fried foods. Married- 4 children.    Pt had some recent chest discomfort which may be anxiety related but unclear.  At work past Thursday felt chest discomfort. He left work due to pain but pain subsided so he  so he returned.   He mentioned also at home later in day felt some pain in his chest same day(thursday). Some faint discomfort in his neck that day before he went to the ED. Work up was negative on January 25, 2017 and told dx anxiety. ED note reviewed today   Pt had extensive work up of heart with cardiologist by Dr. Allyson SabalBerry. Negative myocardial perfusion test in 01-25-2017. Also 2-D echo was normal at [facility]. In past patient hdl was 36. Other lipid markers were normal. FH negative for stroke and MI.   Pt does in the past report remote history of some pectoral pain at [place] furniture. Pushing furniture across the floor.    Recently with chest pain at work and feeling of weakness has caused him to missed work. He states has been out of work recently since past Friday. Tried to work on Monday and he started feeling bad. Also he describes extended absence from work in past due to above type symptoms.  Pt might feel anxious but he can't pin point anything in particularly. Pt went to a  practice yesterday at 9:30 am for counseling.  Occasionally he feels heart beat faster and pounds..Rare palpitation.     Review of Systems  Constitutional: Positive for fatigue. Negative for chills and fever.       When he was discussing his various chest discomfort events he states he just felt  weak.   Respiratory: Negative for chest tightness, shortness of breath and wheezing.        No chest pain.  Cardiovascular: Negative for chest pain and palpitations.  Gastrointestinal: Negative for abdominal pain, blood in stool, diarrhea and nausea.  Genitourinary: Negative for dysuria and flank pain.  Musculoskeletal: Negative for back pain.       No leg pain.  Skin: Negative for rash.  Neurological: Negative for dizziness, seizures, speech difficulty, weakness, numbness and headaches.       Transient mild light headed sensation for one minute or so during the exam then subisided.  Hematological: Negative for adenopathy. Does not bruise/bleed easily.  Psychiatric/Behavioral: Negative for agitation, behavioral problems, confusion, sleep disturbance and suicidal ideas. The patient is nervous/anxious.        Possible not yet clear.    Past Medical History:  Diagnosis Date  . Adjustment disorder   . Anxiety   . Bronchitis      Social History   Social History  . Marital status: Married    Spouse name: N/A  . Number of children: N/A  . Years of education: N/A   Occupational History  . Not on file.   Social History Main Topics  . Smoking status: Never Smoker  . Smokeless tobacco: Never Used  . Alcohol use No  . Drug use: No  . Sexual activity: Not on  file   Other Topics Concern  . Not on file   Social History Narrative  . No narrative on file    History reviewed. No pertinent surgical history.  History reviewed. No pertinent family history.  No Known Allergies  Current Outpatient Prescriptions on File Prior to Visit  Medication Sig Dispense Refill  . hydrOXYzine (ATARAX/VISTARIL) 25 MG tablet Take 1 tablet (25 mg total) by mouth every 6 (six) hours as needed for anxiety. 12 tablet 0   No current facility-administered medications on file prior to visit.     BP (!) 121/59   Pulse 74   Temp 98 F (36.7 C) (Oral)   Resp 16   Ht 5\' 7"  (1.702 m)   Wt 206 lb  (93.4 kg)   SpO2 100%   BMI 32.26 kg/m       Objective:   Physical Exam  General Mental Status- Alert. General Appearance- Not in acute distress.   Skin General: Color- Normal Color. Moisture- Normal Moisture.  Neck Carotid Arteries- Normal color. Moisture- Normal Moisture. No carotid bruits. No JVD.  Chest and Lung Exam Auscultation: Breath Sounds:-Normal.  Cardiovascular Auscultation:Rythm- Regular. Murmurs & Other Heart Sounds:Auscultation of the heart reveals- No Murmurs.  Abdomen Inspection:-Inspeection Normal. Palpation/Percussion:Note:No mass. Palpation and Percussion of the abdomen reveal- Non Tender, Non Distended + BS, no rebound or guarding.    Neurologic Cranial Nerve exam:- CN III-XII intact(No nystagmus), symmetric smile. Strength:- 5/5 equal and symmetric strength both upper and lower extremities.      Assessment & Plan:  For your episodes of fatigue will get cmp, tsh, t4 and cbc today.  For your persisting atypical chest pain episodes(but not today) we did ekg today. Was normal.(NSR)  I will send referra/note to Dr. Allyson SabalBerry to see if they want follow up or any other studies that may be needed such as holter as you report occasional fast rate and rare palpitation.  Please see counselor or psychiatry where you were referred. Anxiety may be component but this is not clear. I am going to give you a trial of low dose//low number clonazepam that you can use in event of recurrent symptoms. It may be helpful to see if with use symptoms resolve rapidly.(rx advisement on side effect clonazepam)  Also for hx of pectoralis pain or cartlidge pain related to work will rx diclofenac to use over next 7 days. Will see if this helps resolve symptoms.  Please get cxr today and heart protein study.  Work note given today. Bring old copy of fmla note prior MD filled.   Follow Wednesday. Ask for 1 pm slot and let then know I ok'd 30 minute.  Tunis Gentle, Ramon DredgeEdward, PA-C

## 2017-03-04 NOTE — Telephone Encounter (Signed)
I accidentally placed future tsh and t4 on this patient. Can you take those out/cancel them.

## 2017-03-04 NOTE — Telephone Encounter (Signed)
Can you run a1-c with labs drawn on Friday. Let me know if you can't. Order placed.

## 2017-03-04 NOTE — Patient Instructions (Signed)
For your episodes of fatigue will get cmp, tsh, t4 and cbc today.  For your persisting atypical chest pain episodes(but not today) we did ekg today. Was normal.  I will send referra/note to Dr. Allyson SabalBerry to see if they want follow up or any other studies that may be needed such as holter as you report occasional fast rate and rare palpitation.  Please see counselor or psychiatry where you were referred. Anxiety may be component but this is not clear. I am going to give you a trial of low dose//low number clonazepam that you can use in event of recurrent symptoms. It may be helpful to see if with use symptoms resolve rapidly.  Also for hx of pectoralis pain or cartlidge pain related to work will rx diclofenac to use over next 7 days. Will see if this helps resolve symptoms.  Please get cxr today and heart protein study.  Work note given today. Bring old copy of fmla note prior MD filled.   Follow Wednesday. Ask for 1 pm slot and let then know I ok'd 30 minute.

## 2017-03-05 NOTE — Telephone Encounter (Signed)
Opened to review 

## 2017-03-07 ENCOUNTER — Other Ambulatory Visit (INDEPENDENT_AMBULATORY_CARE_PROVIDER_SITE_OTHER): Payer: No Typology Code available for payment source

## 2017-03-07 DIAGNOSIS — R739 Hyperglycemia, unspecified: Secondary | ICD-10-CM

## 2017-03-07 LAB — HEMOGLOBIN A1C: HEMOGLOBIN A1C: 6.1 % (ref 4.6–6.5)

## 2017-03-07 NOTE — Telephone Encounter (Signed)
Add on faxed to [facility] for A1c. 03-07-17 @ 0840. KMP

## 2017-03-08 ENCOUNTER — Telehealth: Payer: Self-pay | Admitting: Medical

## 2017-03-08 NOTE — Telephone Encounter (Signed)
Relation to WU:JWJXpt:self  Call back [phone]   Reason for call:  Patient returning call regarding lab results

## 2017-03-09 ENCOUNTER — Ambulatory Visit (INDEPENDENT_AMBULATORY_CARE_PROVIDER_SITE_OTHER): Payer: No Typology Code available for payment source | Admitting: Medical

## 2017-03-09 VITALS — BP 123/72 | HR 74 | Temp 98.0°F | Resp 16 | Ht 67.0 in | Wt 207.6 lb

## 2017-03-09 DIAGNOSIS — R002 Palpitations: Secondary | ICD-10-CM

## 2017-03-09 DIAGNOSIS — F419 Anxiety disorder, unspecified: Secondary | ICD-10-CM

## 2017-03-09 DIAGNOSIS — R0789 Other chest pain: Secondary | ICD-10-CM | POA: Diagnosis not present

## 2017-03-09 NOTE — Patient Instructions (Addendum)
For your hx of some anxiety, atypical chest discomfort and palpitation will advise continue the klonopin if needed. Can use 1/2 tab to see if this adequate to stop discomfort.  Also advised use diclofenac for next 7 days. This would be helpful in event muscle or cartlidge pain.  Will ask staff to go ahead and expedite the referral to his former cardiologist to evaluate his palpitation and atypical discomfort sensation.   Referral to cardiologist would be a big help in determining when he can go back to work. He is new pt to me and he has already missed.  Follow up date to be determined after cardiologist evaluation.  Note today as stated getting ekg in office was impossible. I asked both 2 MA who run ekg and with our computer down. Both confirmed ekg not operable. With his former work ups negative and his recent response to meds and normal ekg last time I did not think ekg was absolutely necessary today. But did advise him if his symptoms worsen or change then go to ED tonight. Note I waited prolonged time to give opportunity for epic to operate but it did not. I also did not think directing him to ED was necessary. This was explained to pt and he expressed understanding.

## 2017-03-09 NOTE — Telephone Encounter (Signed)
Tried to reach pt. Left voicemail to call back also mailed results

## 2017-03-09 NOTE — Progress Notes (Signed)
Subjective:    Patient ID: Samuel Munoz, male    DOB: 1966-07-18, 51 y.o.   MRN: [id number]  HPI Pt seen today for a follow up. We had epic computer shutdown at time of the exam. So at time of exam could not review last note or place any orders.   Pt updates me on his atypical chest discomfort/ palpitation pain(see last note). On last visit he had negative stress test and work up by his former cardiologist. I had written him anxiety to see if his symptoms would resolve with klonopin and also gave diclofenac to see if he may have muscle component(or cartlidge pain componnent).   His last ekg was normal on 03-04-2017. So I thought above might be helpful to determine if anxiety factor in his symptoms and if diclofenac would help indicated muscle or cartlidge source.  Pt state twice since last visit he used clonazepam when he felt slight faint pressure sensation leaning backwards and  both time he states symptom of faint pressure resolved rapidly. Then seemed to return after 8 hours. He seems to think when med wore off symptoms returned.  Also he mentioned faint transient discomfort before he went in to [employer] and when took diclofenac symptoms went away as well.  All episodes of discomfort in chest very faint pain. No chest pressure. No report associated nausea, vomiting, sweating, jaw pain or shortness of breath.   Later during interview he reported brief episodes of palpitations. 1-3 seconds then subsided. Then he mentioned since last visit some faint transient palpitations sensation appoximate 2-3 times.     Review of Systems  Constitutional: Negative for chills, fatigue and fever.  Respiratory: Negative for cough, chest tightness, shortness of breath and wheezing.   Cardiovascular: Positive for palpitations. Negative for chest pain.       Faint transient today.  Gastrointestinal: Negative for abdominal distention, abdominal pain, constipation, diarrhea and vomiting.  Genitourinary:  Negative for dysuria and flank pain.  Musculoskeletal: Negative for back pain.  Skin: Negative for rash.  Neurological: Negative for dizziness, weakness, numbness and headaches.  Hematological: Negative for adenopathy. Does not bruise/bleed easily.  Psychiatric/Behavioral: Negative for behavioral problems, confusion, self-injury and sleep disturbance. The patient is nervous/anxious.     Past Medical History:  Diagnosis Date  . Adjustment disorder   . Anxiety   . Bronchitis      Social History   Social History  . Marital status: Married    Spouse name: N/A  . Number of children: N/A  . Years of education: N/A   Occupational History  . Not on file.   Social History Main Topics  . Smoking status: Never Smoker  . Smokeless tobacco: Never Used  . Alcohol use No  . Drug use: No  . Sexual activity: Yes   Other Topics Concern  . Not on file   Social History Narrative  . No narrative on file    No past surgical history on file.  No family history on file.  No Known Allergies  Current Outpatient Prescriptions on File Prior to Visit  Medication Sig Dispense Refill  . clonazePAM (KLONOPIN) 0.5 MG tablet Take 1 tablet (0.5 mg total) by mouth 2 (two) times daily as needed for anxiety. 3 tablet 0  . diclofenac (VOLTAREN) 75 MG EC tablet Take 1 tablet (75 mg total) by mouth 2 (two) times daily. 14 tablet 0  . hydrOXYzine (ATARAX/VISTARIL) 25 MG tablet Take 1 tablet (25 mg total) by mouth every 6 (six)  hours as needed for anxiety. 12 tablet 0   No current facility-administered medications on file prior to visit.     BP 123/72   Pulse 74   Temp 98 F (36.7 C) (Oral)   Resp 16   Ht 5\' 7"  (1.702 m)   Wt 207 lb 9.6 oz (94.2 kg)   SpO2 100%   BMI 32.51 kg/m       Objective:   Physical Exam  General Mental Status- Alert. General Appearance- Not in acute distress.   Skin General: Color- Normal Color. Moisture- Normal Moisture.  Neck Carotid Arteries- Normal  color. Moisture- Normal Moisture. No carotid bruits. No JVD.  Chest and Lung Exam Auscultation: Breath Sounds:-Normal.  Cardiovascular Auscultation:Rythm- Regular.(ausculate various time when he reported felt brief palpitation sensation in office. Normal rhythm at those times) Murmurs & Other Heart Sounds:Auscultation of the heart reveals- No Murmurs.  Abdomen Inspection:-Inspeection Normal. Palpation/Percussion:Note:No mass. Palpation and Percussion of the abdomen reveal- Non Tender, Non Distended + BS, no rebound or guarding.    Neurologic Cranial Nerve exam:- CN III-XII intact(No nystagmus), symmetric smile. tStrength:- 5/5 equal and symmetric strength both upper and lower extremities.  Anterior thorax- on palpation left lower costochondral junction faint pain on palpation.         Assessment & Plan:  For your hx of some anxiety, atypical chest discomfort and palpitation will advise continue the klonopin if needed. Can use 1/2 tab to see if this adequate to stop discomfort.  Also advised use diclofenac for next 7 days. This would be helpful in event muscle or cartlidge pain.  Will ask staff to go ahead and expedite the referral to his former cardiologist to evaluate his palpitation and atypical discomfort sensation.   Referral to cardiologist would be a big help in determining when he can go back to work. He is new pt to me and he has already missed.  Follow up date to be determined after cardiologist evaluation.  Note today as stated getting ekg in office was impossible. I asked both 2 MA who run ekg and with our computer down. Both confirmed ekg not operable. With his former work ups negative and his recent response to meds and normal ekg last time I did not think ekg was absolutely necessary today. But did advise him if his symptoms worsen or change then go to ED tonight. Note I waited prolonged time to give opportunity for epic to operate but it did not. I also did not  think directing him to ED was necessary. This was explained to pt and he expressed understanding.

## 2017-03-10 ENCOUNTER — Telehealth: Payer: Self-pay | Admitting: Medical

## 2017-03-10 ENCOUNTER — Telehealth: Payer: Self-pay

## 2017-03-10 NOTE — Telephone Encounter (Signed)
Pt dropped off short term disability form for Samuel Munoz to fill out, documents were left in tray at front office, pt requested form is faxed

## 2017-03-10 NOTE — Telephone Encounter (Signed)
I do agree that his issue is very likely anxiety related. But at same time one of his predominant symptoms is palpitations. I want the cardiologist to give opinion whether or not he needs holter. Again as stated for me to fill out any paper work going forward or explaining work abscences or any other potential forms I would need all the information. Also work limitations for anxiety alone would differ a lot if had palpitations or svt or other arrythmia.. So I still want him to see cardiologist.

## 2017-03-10 NOTE — Telephone Encounter (Signed)
Pt states that he is feeling better. Pt's therapist told him since he's seen his PCP and his heart is fine the symptoms he's being having is due to anxiety. Pt wants to hold off on referral for cardiologist. Pt reports he's going to go back to work on Monday.

## 2017-03-10 NOTE — Telephone Encounter (Signed)
Tried to reach pt. to see how he's doing not answer pt did not get EKG yesterday due to system being down. Left pt a message to call back.

## 2017-03-14 NOTE — Telephone Encounter (Signed)
Pt called in to follow up on paperwork. Pt would like to have faxed as soon as possible. Advised pt of paper work turn around time and that provider will get complete as soon as possible.

## 2017-03-15 NOTE — Telephone Encounter (Signed)
Left pt a message to call back. 

## 2017-03-16 ENCOUNTER — Telehealth: Payer: Self-pay | Admitting: Medical

## 2017-03-16 ENCOUNTER — Ambulatory Visit (INDEPENDENT_AMBULATORY_CARE_PROVIDER_SITE_OTHER): Payer: No Typology Code available for payment source | Admitting: Medical

## 2017-03-16 ENCOUNTER — Telehealth: Payer: Self-pay | Admitting: Cardiovascular Disease

## 2017-03-16 VITALS — BP 124/71 | HR 65 | Temp 97.8°F | Resp 16 | Ht 67.0 in | Wt 206.0 lb

## 2017-03-16 DIAGNOSIS — R002 Palpitations: Secondary | ICD-10-CM | POA: Diagnosis not present

## 2017-03-16 DIAGNOSIS — R531 Weakness: Secondary | ICD-10-CM | POA: Diagnosis not present

## 2017-03-16 DIAGNOSIS — R0789 Other chest pain: Secondary | ICD-10-CM | POA: Diagnosis not present

## 2017-03-16 LAB — EKG 12-LEAD

## 2017-03-16 LAB — TROPONIN I: TNIDX: 0 ug/L (ref 0.00–0.06)

## 2017-03-16 NOTE — Progress Notes (Signed)
Subjective:    Patient ID: Samuel Munoz, male    DOB: 28-Apr-1966, 51 y.o.   MRN: [id number]  HPI   Pt in states he tried he went to work yesterday. He went to work at 7 am. He states he was taking it easy but then around 3 hours into the work started to feel weak and shortness of breath. He states he felt little dizzy. He had to leave work and then felt better.  Yesterday no flutter or palpitation sensation.  But today he felt flutter/palpitation twice. He states both time lasted for seconds and then subsided.  Pt has been to social worker/counseled. She works at [employer]. Pt had communicated to our staff he thought referral to cardiologist was not necessary. But I had advised seeing cardiologist for in depth work up and likely holter.   Also he felt week and felt like his heart was hurting. Lasted for about 2.5 hours. Then subsided.  Prior nuclear stress test was negative.  Pt during interview he had weak sensation. He needed to get off table. He sat down and then weak sensation subsided within 2 minutes.    Last night he had sensation of tachycardia and slight faint pain possibly. Also Friday night random palpitation sensations.  Pt did take diclofenac the other day consistently and did not help.  Then he states tried clonazepam and did not help much.   Review of Systems  Constitutional: Negative for chills, fatigue and fever.  Respiratory: Negative for cough, chest tightness, shortness of breath and wheezing.   Cardiovascular: Positive for palpitations. Negative for chest pain.  Gastrointestinal: Negative for abdominal pain.  Musculoskeletal: Negative for back pain.  Skin: Negative for rash.  Neurological: Positive for weakness. Negative for dizziness, speech difficulty, numbness and headaches.  Hematological: Negative for adenopathy. Does not bruise/bleed easily.  Psychiatric/Behavioral: Negative for behavioral problems, confusion, dysphoric mood and sleep disturbance. The  patient is nervous/anxious.     Past Medical History:  Diagnosis Date  . Adjustment disorder   . Anxiety   . Bronchitis      Social History   Social History  . Marital status: Married    Spouse name: N/A  . Number of children: N/A  . Years of education: N/A   Occupational History  . Not on file.   Social History Main Topics  . Smoking status: Never Smoker  . Smokeless tobacco: Never Used  . Alcohol use No  . Drug use: No  . Sexual activity: Yes   Other Topics Concern  . Not on file   Social History Narrative  . No narrative on file    No past surgical history on file.  No family history on file.  No Known Allergies  Current Outpatient Prescriptions on File Prior to Visit  Medication Sig Dispense Refill  . clonazePAM (KLONOPIN) 0.5 MG tablet Take 1 tablet (0.5 mg total) by mouth 2 (two) times daily as needed for anxiety. 3 tablet 0  . diclofenac (VOLTAREN) 75 MG EC tablet Take 1 tablet (75 mg total) by mouth 2 (two) times daily. 14 tablet 0  . hydrOXYzine (ATARAX/VISTARIL) 25 MG tablet Take 1 tablet (25 mg total) by mouth every 6 (six) hours as needed for anxiety. 12 tablet 0   No current facility-administered medications on file prior to visit.     BP 124/71   Pulse 65   Temp 97.8 F (36.6 C) (Oral)   Resp 16   Ht 5\' 7"  (1.702 m)  Wt 206 lb (93.4 kg)   SpO2 100%   BMI 32.26 kg/m       Objective:   Physical Exam   General Mental Status- Alert. General Appearance- Not in acute distress.   Skin General: Color- Normal Color. Moisture- Normal Moisture.  Neck Carotid Arteries- Normal color. Moisture- Normal Moisture. No carotid bruits. No JVD.  Chest and Lung Exam Auscultation: Breath Sounds:-Normal.  Cardiovascular Auscultation:Rythm- Regular. Murmurs & Other Heart Sounds:Auscultation of the heart reveals- No Murmurs.  Abdomen Inspection:-Inspeection Normal. Palpation/Percussion:Note:No mass. Palpation and Percussion of the abdomen  reveal- Non Tender, Non Distended + BS, no rebound or guarding.  Neurologic Cranial Nerve exam:- CN III-XII intact(No nystagmus), symmetric smile. Strength:- 5/5 equal and symmetric strength both upper and lower extremities.     Assessment & Plan:  For recurrent palpitations and transient subjectivet heart weakness(along with generalized weakness symptoms) am trying to get you with cardiologist for holter monitor and further evaluation.  Your ekg today looked ok.  Probable. 1st degree av block but no skipped beats. No ischemia seen.  Will get labs today including troponin as precaution measure. But again if symptoms worsen then ED evaluation.  I am wiling to fill out your forms but I would like some definitive information on holter monitor in order to fill out any form more accurately as reason for abscenses may be anxiety, cardiac or combination of both. Unclear at this time.  Please have you work fax over blank forms. May need to call you to fill out and get dates.  Follow up in 7 days or as needed  Blanchie Zeleznik, Ramon Dredge, [employer]

## 2017-03-16 NOTE — Telephone Encounter (Signed)
Disability paperwork received and forwarded to PCP for completion.

## 2017-03-16 NOTE — Patient Instructions (Addendum)
For recurrent palpitations and transient subjective heart weakness(along with generalized weakness symptoms)  I am trying to get you with cardiologist for holter monitor and further evaluation.  Your ekg today looked ok.  Probable. 1st degree av block but no skipped beats.  Will get labs today including troponin as precaution measure. But again if symptoms worsen then ED evaluation.  I am wiling to fill out your forms but I would like some definitive information on holter monitor in order to fill out any form more accurately as reason for abscenses may be anxiety, cardiac or combination of both. Unclear at this time.  Please have you work fax over blank forms. May need to call you to fill out and get dates.  Follow up in 7 days or as needed  Will get a1c today and future tsh and t4 placed the other day.

## 2017-03-16 NOTE — Telephone Encounter (Signed)
Spoke with pt he states that yesterday "it happened" twice when he was getting ready for work and again in the afternoon, again today is a little better today thinks it because he was going to PCP. He states that yesterday episodes were better and then this morning it is better today than last week per pt. Will add on pt to Friday schedule per [place] direction pt is not answering questions directly just vague answers. Appt booked for friday

## 2017-03-16 NOTE — Telephone Encounter (Signed)
Opened by accident

## 2017-03-16 NOTE — Telephone Encounter (Signed)
Friday 10:30 AM is the earlist I can double book, depend on patient, if he is not having frequent symptom, I can see him as previously scheduled, but if he is having daily symptom, I can see him early.

## 2017-03-16 NOTE — Telephone Encounter (Signed)
New Message   Jenn with [facility] called to schedule appt with Dr. Allyson SabalBerry. First available was 05/06/17. Wants to know if patient can be worked in sooner due to patient having palpitations, chest pain, neck pain. Symptoms still recurrent. Went ahead and scheduled appt with Azalee CourseHao Meng for Aug 30, but wants to see if patient can be worked in sooner.

## 2017-03-18 ENCOUNTER — Ambulatory Visit (INDEPENDENT_AMBULATORY_CARE_PROVIDER_SITE_OTHER): Payer: No Typology Code available for payment source | Admitting: Physician Assistant

## 2017-03-18 ENCOUNTER — Encounter: Payer: Self-pay | Admitting: Physician Assistant

## 2017-03-18 VITALS — BP 120/72 | HR 85 | Ht 67.0 in | Wt 206.4 lb

## 2017-03-18 DIAGNOSIS — R079 Chest pain, unspecified: Secondary | ICD-10-CM | POA: Diagnosis not present

## 2017-03-18 DIAGNOSIS — R002 Palpitations: Secondary | ICD-10-CM

## 2017-03-18 NOTE — Patient Instructions (Signed)
Medication Instructions:   No changes  Labwork:   none  Testing/Procedures:  Your physician has requested that you have coronary CT. Computed tomography (CT) is a painless test that uses an x-ray machine to take clear, detailed pictures of your heart arteries. For further information please visit [email or web address] Please follow instruction sheet as given.    Follow-Up:  With Dr. Allyson Sabal in October or November   If you need a refill on your cardiac medications before your next appointment, please call your pharmacy.

## 2017-03-18 NOTE — Progress Notes (Signed)
Cardiology Office Note    Date:  03/20/2017   ID:  Samuel Munoz, DOB 04/21/1966, MRN [id number]  PCP:  Esperanza Richters, PA-C  Cardiologist:  Dr. Allyson Sabal   Chief Complaint  Patient presents with  . Follow-up    seen for Dr. Allyson Sabal. Recurrent chest pain, palpitation and fatigue    History of Present Illness:  Levorn Oleski is a 51 y.o. male with PMH of anxiety present for evaluation of neck pain and palpitation. He was last seen by Dr. Allyson Sabal on 01/12/2017 for evaluation of atypical chest pain. He does not have any obvious cardiovascular risk factors. He was in the ED in May 2018 was chest pain. He was ruled out by enzyme. Recent echocardiogram 01/04/2017 obtained at outside hospital showed EF 65-70%, no significant valvular abnormality. He ended up having CTA of the head and neck on the same day which did not reveal any significant abnormality. EEG was negative for seizure and no evidence of focal or diffuse abnormality. MRI of the brain was negative for acute process. A Myoview was ordered on 01/25/2017 which showed EF 66%, low risk study, no obvious ischemia or infarction.  He recently went to the ED again on 02/24/2017 feeling anxious with fatigue, shortness of breath and palpitation. He also described a tightness in his neck and jaw. He presents again today complaining of recurrent chest discomfort, however his chest discomfort does not seems to be worse in with activity. Sometimes they can last hours at a time. He feel very fatigued. He occasionally also noticed some palpitation, however that is not the main concern here nor does it occur very frequently. He denies any orthopnea or PND, however and occasionally notice some shortness of breath laying down at night. It does not persist. I have reviewed the previous stress test result with him, probability is relatively low for ACS despite prolonged episodes of chest discomfort. Given his recurrent discomfort, I recommended a coronary CT, if test  negative, I would not recommend any further study. I think there maybe a component of anxiety behind his symptom presentation   Past Medical History:  Diagnosis Date  . Adjustment disorder   . Anxiety   . Bronchitis     Past Surgical History:  Procedure Laterality Date  . NO PAST SURGERIES      Current Medications: Outpatient Medications Prior to Visit  Medication Sig Dispense Refill  . clonazePAM (KLONOPIN) 0.5 MG tablet Take 1 tablet (0.5 mg total) by mouth 2 (two) times daily as needed for anxiety. (Patient not taking: Reported on 03/18/2017) 3 tablet 0  . diclofenac (VOLTAREN) 75 MG EC tablet Take 1 tablet (75 mg total) by mouth 2 (two) times daily. (Patient not taking: Reported on 03/18/2017) 14 tablet 0  . hydrOXYzine (ATARAX/VISTARIL) 25 MG tablet Take 1 tablet (25 mg total) by mouth every 6 (six) hours as needed for anxiety. (Patient not taking: Reported on 03/18/2017) 12 tablet 0   No facility-administered medications prior to visit.      Allergies:   Patient has no known allergies.   Social History   Social History  . Marital status: Married    Spouse name: N/A  . Number of children: N/A  . Years of education: N/A   Social History Main Topics  . Smoking status: Never Smoker  . Smokeless tobacco: Never Used  . Alcohol use No  . Drug use: No  . Sexual activity: Yes   Other Topics Concern  . None   Social History  Narrative  . None     Family History:  The patient's family history includes Alcohol abuse in his father; Breast cancer in his mother.   ROS:   Please see the history of present illness.    ROS All other systems reviewed and are negative.   PHYSICAL EXAM:   VS:  BP 120/72   Pulse 85   Ht 5\' 7"  (1.702 m)   Wt 206 lb 6.4 oz (93.6 kg)   BMI 32.33 kg/m    GEN: Well nourished, well developed, in no acute distress  HEENT: normal  Neck: no JVD, carotid bruits, or masses Cardiac: RRR; no murmurs, rubs, or gallops,no edema  Respiratory:  clear to  auscultation bilaterally, normal work of breathing GI: soft, nontender, nondistended, + BS MS: no deformity or atrophy  Skin: warm and dry, no rash Neuro:  Alert and Oriented x 3, Strength and sensation are intact Psych: euthymic mood, full affect  Wt Readings from Last 3 Encounters:  03/18/17 206 lb 6.4 oz (93.6 kg)  03/16/17 206 lb (93.4 kg)  03/09/17 207 lb 9.6 oz (94.2 kg)      Studies/Labs Reviewed:   EKG:  EKG is ordered today.  The ekg ordered today demonstrates Normal sinus rhythm no significant ST-T wave changes  Recent Labs: 03/04/2017: ALT 19; BUN 11; Creatinine, Ser 0.98; Hemoglobin 14.0; Platelets 246.0; Potassium 3.9; Sodium 141; TSH 0.82   Lipid Panel No results found for: CHOL, TRIG, HDL, CHOLHDL, VLDL, LDLCALC, LDLDIRECT  Additional studies/ records that were reviewed today include:   Myoview 01/25/2017 Study Highlights     The left ventricular ejection fraction is hyperdynamic (>65%).  Nuclear stress EF: 66%.  No T wave inversion was noted during stress.  There was no ST segment deviation noted during stress.  Blood pressure demonstrated a normal response to exercise.  This is a low risk study.   Normal perfusion. LVEF 66% with normal wall motion. This is a low risk study.      ASSESSMENT:    1. Chest pain, unspecified type   2. Palpitation      PLAN:  In order of problems listed above:  1. Chest pain: He had echocardiogram done in June at [facility] which showed a normal EF. Myoview obtained here was also normal as well. Given recurrence of symptoms, we can obtain a coronary CT, however if negative I would not recommend any further testing. We discussed various contributions to chest pain including cardiovascular, pulmonary, GI, musculoskeletal versus anxiety. If coronary CT negative, I would recommend him to seek alternative explanation for his chest discomfort  2. Palpitation: He describes occasional palpitation sensation, however does  not appears to be the main issue of today's visit nor does it occur very frequently compared to the chest pain.    Medication Adjustments/Labs and Tests Ordered: Current medicines are reviewed at length with the patient today.  Concerns regarding medicines are outlined above.  Medication changes, Labs and Tests ordered today are listed in the Patient Instructions below. Patient Instructions  Medication Instructions:   No changes  Labwork:   none  Testing/Procedures:  Your physician has requested that you have coronary CT. Computed tomography (CT) is a painless test that uses an x-ray machine to take clear, detailed pictures of your heart arteries. For further information please visit [email or web address] Please follow instruction sheet as given.    Follow-Up:  With Dr. Allyson Sabal in October or November   If you need a refill on your cardiac medications  before your next appointment, please call your pharmacy.      Ramond Dial, [place]  03/20/2017 10:26 AM    [facility] [address] [place], [place], [place]  [address] Phone: [phone]; Fax: [phone]

## 2017-03-20 ENCOUNTER — Telehealth: Payer: Self-pay | Admitting: Medical

## 2017-03-20 ENCOUNTER — Encounter: Payer: Self-pay | Admitting: Physician Assistant

## 2017-03-20 NOTE — Telephone Encounter (Signed)
Opened to review 

## 2017-03-21 ENCOUNTER — Telehealth: Payer: Self-pay | Admitting: Medical

## 2017-03-21 NOTE — Telephone Encounter (Signed)
What pt turned in was a form different from fmla. I filled it out and was about to put him back to work. He should clarify with work what they want me to fill out. I had mentioned fmla form to him and he gave me different form See copy of the form.  If he wants fmla form filled out make another appointment. Needs to be 30 minutes. He mentioned new symptoms of feeling like he could not move. This may require neurologic evaluation and referral.   He will need to bring any new forms he wants me to fill out. Needs at least 30 minutes.

## 2017-03-21 NOTE — Telephone Encounter (Signed)
Caller name: Relation to WE:[employer] Call back number:[phone] Pharmacy:  Reason for call: pt is calling to check the status of his FMLA paperwork, states Ramon Dredge informed him that he would have it available on Friday, also wants to make Ramon Dredge aware that he did see a specialist . Please advise

## 2017-03-21 NOTE — Telephone Encounter (Addendum)
Pt states he does not think he is ready to go back to work. Pt reports on Friday and Saturday he felt funny he could not move or stand on his feet and some anxiety.  Pt state he's waiting on cardiologist to call ans set up last test. Pt called back and states his job thinks it's better for him to be out for one or two month to get better instead of missing a few day coming back and then being out again.

## 2017-03-22 NOTE — Telephone Encounter (Signed)
Can you please schedule pt foe me

## 2017-03-22 NOTE — Telephone Encounter (Signed)
Patient scheduled for 03/23/17 at 8am with PCP

## 2017-03-23 ENCOUNTER — Ambulatory Visit (INDEPENDENT_AMBULATORY_CARE_PROVIDER_SITE_OTHER): Payer: No Typology Code available for payment source | Admitting: Medical

## 2017-03-23 ENCOUNTER — Telehealth: Payer: Self-pay | Admitting: Medical

## 2017-03-23 ENCOUNTER — Encounter: Payer: Self-pay | Admitting: Medical

## 2017-03-23 VITALS — BP 120/71 | HR 71 | Temp 98.1°F | Resp 16 | Ht 67.0 in | Wt 205.8 lb

## 2017-03-23 DIAGNOSIS — R0789 Other chest pain: Secondary | ICD-10-CM

## 2017-03-23 DIAGNOSIS — F419 Anxiety disorder, unspecified: Secondary | ICD-10-CM | POA: Diagnosis not present

## 2017-03-23 DIAGNOSIS — R0600 Dyspnea, unspecified: Secondary | ICD-10-CM

## 2017-03-23 DIAGNOSIS — R002 Palpitations: Secondary | ICD-10-CM

## 2017-03-23 NOTE — Patient Instructions (Addendum)
For palpitations will go ahead and send note to cardiologist again requesting a holter monitor as his symptoms are persisting palpitation like. 4 episodes the other night.Normal rhythm on ausculation decided repeat ekg not needed.   Do get the coronary CT.  Will also refer to pulmonologist  for possible PFT.  Will put in referral so he can go to psychiatrist. Gave the sheet to call one and then notify us.  Modified your disability form I filled out. Changed to not able to work presently at your request and employer. But keep in mind I could fill out fmla forms as well.  Follow up in 2 weeks or as needed  Again as we discussed any severe signs or symptoms then ED evaluation.

## 2017-03-23 NOTE — Progress Notes (Signed)
Subjective:    Patient ID: Samuel Munoz, male    DOB: Dec 11, 1965, 51 y.o.   MRN: [id number]  HPI   Pt in for follow up. He states only doing coronary CT. Saw cardiologist and holter monitor not done. Decided not necessary per pt and review of chart/note.  Pt states at work last Monday when he tried to work he felt recurrent chest pain and shortness of breath. He had to leave work. He went home and rested.  Also twice Friday and Saturday he felt transient heart flutter and weak sensation. Pt states he sat down and like his heart slow down briefly going into a "funny rhythm. Then he briefly felt like he could not move his whole body. This lasted for about 2 minutes his heart felt normal and he was able to move. Then shortly after this he states he got very anxious for about a minute or longer.  Last night while seated on coach felt transient recurrent palpitation 4 times last night.  Pt has seen a counselor but has not seen  Psychiatrist. I have fill out at pt request disability form. But pt feels like he is not able to return to work. He states his work told him to take month or two off.   On review pt has missed numerous days off work.     Review of Systems  Constitutional: Positive for fatigue. Negative for chills.  Gastrointestinal: Negative for abdominal pain.  Musculoskeletal: Negative for back pain and joint swelling.  Skin: Negative for rash.  Neurological: Negative for dizziness, seizures, syncope, speech difficulty, weakness and numbness.       Brief episode when feels heart palpitations. Feels like can't move his body during episodes for a minute or 2.  Hematological: Negative for adenopathy. Does not bruise/bleed easily.  Psychiatric/Behavioral: Negative for agitation, confusion, dysphoric mood, sleep disturbance and suicidal ideas. The patient is nervous/anxious.     Past Medical History:  Diagnosis Date  . Adjustment disorder   . Anxiety   . Bronchitis        Social History   Social History  . Marital status: Married    Spouse name: N/A  . Number of children: N/A  . Years of education: N/A   Occupational History  . Not on file.   Social History Main Topics  . Smoking status: Never Smoker  . Smokeless tobacco: Never Used  . Alcohol use No  . Drug use: No  . Sexual activity: Yes   Other Topics Concern  . Not on file   Social History Narrative  . No narrative on file    Past Surgical History:  Procedure Laterality Date  . NO PAST SURGERIES      Family History  Problem Relation Age of Onset  . Breast cancer Mother   . Alcohol abuse Father     No Known Allergies  No current outpatient prescriptions on file prior to visit.   No current facility-administered medications on file prior to visit.     BP 120/71   Pulse 71   Temp 98.1 F (36.7 C) (Oral)   Resp 16   Ht 5\' 7"  (1.702 m)   Wt 205 lb 12.8 oz (93.4 kg)   SpO2 100%   BMI 32.23 kg/m       Objective:   Physical Exam  General Mental Status- Alert. General Appearance- Not in acute distress.   Skin General: Color- Normal Color. Moisture- Normal Moisture.  Neck Carotid Arteries- Normal color.  Moisture- Normal Moisture. No carotid bruits. No JVD.  Chest and Lung Exam Auscultation: Breath Sounds:-Normal.  Cardiovascular Auscultation:Rythm- Regular. Murmurs & Other Heart Sounds:Auscultation of the heart reveals- No Murmurs.  Abdomen Inspection:-Inspeection Normal. Palpation/Percussion:Note:No mass. Palpation and Percussion of the abdomen reveal- Non Tender, Non Distended + BS, no rebound or guarding.   Neurologic Cranial Nerve exam:- CN III-XII intact(No nystagmus), symmetric smile. Drift Test:- No drift. Finger to Nose:- Normal/Intact Strength:- 5/5 equal and symmetric strength both upper and lower extremities.      Assessment & Plan:  For palpitations will go ahead and send note to cardiologist again requesting a holter monitor as his  symptoms are persisting palpitation like. 4 episodes the other night.  Do get the coronary CT.  Will also refer to pulmonologist for possible PFT.  Will put in referral so he can go to psychiatrist. Gave the sheet to call one and then notify us.  Modified your disability form I filled out. Changed to not able to work presently at your request and employer. But keep in mind I could fill out fmla forms as well.  Follow up in 2 weeks or as needed  Again as we discussed any severe signs or symptoms then ED evaluation.  Ellamae Lybeck, Ramon Dredge, PA-C

## 2017-03-23 NOTE — Telephone Encounter (Signed)
Samuel Munoz has communicated w patient regarding recommendations. Orders entered, I've asked scheduling to reach out to patient to set this up.

## 2017-03-23 NOTE — Telephone Encounter (Signed)
I have called Samuel Munoz, his symptom is not occurring on a daily basis, although he did have 4 episodes last night and another episode this morning. The last time he had palpitation was 5 days ago on 8/17. Since the 24 hour and 48 hour holter monitor continuously monitor for the said time period and his symptom may not occur for days at a time, I recommended a 30 day monitor for more definitive testing. We will arrange the 30 day heart monitor.   Thank you  Azalee Course PA-C

## 2017-03-23 NOTE — Telephone Encounter (Signed)
Samuel Munoz,   I saw Mr. Samuel Munoz today. Thanks or seeing him the other day. He continue to report flutter and palpitations to me. I know his work up thus far has been negative. But was still wondering if you think holter indicated.   Thanks or your help with this patient,  Esperanza Richters, [place]

## 2017-03-28 ENCOUNTER — Telehealth: Payer: Self-pay | Admitting: *Deleted

## 2017-03-28 NOTE — Telephone Encounter (Signed)
Patient brought in [facility] paperwork for a continuous period of leave [x2 months post 03/23/17 OV] to be completed per VO from provider/SLS Received new fax from[employer], with signed medical release form,, requesting pt's Medical Records from 01/04/2017 for eligibility of STD benefits/SLS  Both FMLA & STD forms were completed and requested information forwarded via fax with confirmations/SLS 08/29

## 2017-03-30 ENCOUNTER — Telehealth: Payer: Self-pay | Admitting: Medical

## 2017-03-30 NOTE — Telephone Encounter (Signed)
Reviewed & signed patient's FMLA form. Placed it on Samuel Munoz's desk so form to be sent/faxed.

## 2017-03-31 ENCOUNTER — Ambulatory Visit: Payer: No Typology Code available for payment source | Admitting: Physician Assistant

## 2017-04-05 ENCOUNTER — Ambulatory Visit (INDEPENDENT_AMBULATORY_CARE_PROVIDER_SITE_OTHER): Payer: No Typology Code available for payment source | Admitting: Internal Medicine

## 2017-04-05 ENCOUNTER — Encounter: Payer: Self-pay | Admitting: Internal Medicine

## 2017-04-05 VITALS — BP 138/78 | HR 65 | Ht 67.0 in | Wt 206.0 lb

## 2017-04-05 DIAGNOSIS — R0600 Dyspnea, unspecified: Secondary | ICD-10-CM

## 2017-04-05 DIAGNOSIS — R0689 Other abnormalities of breathing: Secondary | ICD-10-CM

## 2017-04-05 DIAGNOSIS — R079 Chest pain, unspecified: Secondary | ICD-10-CM | POA: Diagnosis not present

## 2017-04-05 DIAGNOSIS — R0789 Other chest pain: Secondary | ICD-10-CM

## 2017-04-05 HISTORY — DX: Other abnormalities of breathing: R06.89

## 2017-04-05 HISTORY — DX: Dyspnea, unspecified: R06.00

## 2017-04-05 LAB — NITRIC OXIDE: NITRIC OXIDE: 24

## 2017-04-05 NOTE — Patient Instructions (Signed)
ICD-10-CM   1. Atypical chest pain R07.89   2. Dyspnea and respiratory abnormalities R06.00    R06.89    Do CT chest with contrast Do full PFT  Will call with results - if CT chest normal, then will do pulmonary stress test and regroup

## 2017-04-05 NOTE — Addendum Note (Signed)
Addended by: Sheran LuzEAST, Andrea Colglazier K on: 04/05/2017 12:39 PM   Modules accepted: Orders

## 2017-04-05 NOTE — Progress Notes (Signed)
PCP is Saguier, Kateri Mc Referring Provider is Saguier, Kateri Mc  Chief Complaint  Patient presents with  . Advice Only    Pt is here for consult for dyspnea referred by Dr. Esperanza Richters. 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 When pt sits for a while symptoms go away slightly. Pt's chest hurts on a consistant basis while working.    HPI:  51 year old male slightly overweight but muscular. 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 He tells me that in the 1990s or maybe even in the 1980s 1 day when he was pushing a heavy object with greater than 200 pounds he felt a pull in his parasternal and retrosternal area in the front of the chest. At that time and was diagnosis muscle strain. Since then he's had on and off sensation of pull or soreness in his front chest. This is only mild and intermittent. Always present with exertion or pushing objects. Then since June 2018 this is changed and it is more frequent and more severe. He is not been able to work for the past one month. He says it is present on exertion but there is no radiation or associated cough or wheezing or nocturnal symptoms. He has been worked up by cardiology Dr. Allyson Sabal. Results are below. He has been diagnosed with atypical chest pain. Also had echocardiogram is reported as ejection fraction 65%. I do not know details about diastolic dysfunction. This echo was not available to me. Rest of the workup as detailed below. He tells me that anxiety was considered by his treating physicians but recently the anxiety is improved but this soreness is still there.  Outside charts of PCP and Dr Allyson Sabal reviewed   June 2018 nuclear medicine cardiac stress test: Hyperdynamic left ventricle with low risk study August 2018 chest x-ray personally visualized and is clear August 2018 blood work: Normal creatinine 0.98 mg percent and hemoglobin 14 g percent. Absolute  eosinophil count 100 cells per cubic millimeter 04/05/2017 - Walking desaturation test on 04/05/2017 185 feet x 3 laps on ROOM AIR :  did NOT  desaturate. Rest pulse ox was 100%, final pulse ox was 100%. HR response 69/min at rest to 94 /min at peak exertion. 04/05/2017 - exhaled nitric oxide test 24 ppb and normal - suggesting no airway inflammation of the eosinophilic variety      has a past medical history of Adjustment disorder; Anxiety; and Bronchitis.   reports that he has never smoked. He has never used smokeless tobacco.  Past Surgical History:  Procedure Laterality Date  . NO PAST SURGERIES      No Known Allergies   There is no immunization history on file for this patient.  Family History  Problem Relation Age of Onset  . Breast cancer Mother   . Alcohol abuse Father   . Diabetes Mellitus II Son     No current outpatient prescriptions on file.    Review of Systems  Respiratory: Positive for shortness of breath.   Cardiovascular: Positive for chest pain and palpitations.  Neurological: Positive for light-headedness.  Psychiatric/Behavioral: Positive for sleep disturbance. The patient is nervous/anxious.     BP 138/78 (BP Location: Left Arm, Cuff Size: Normal)   Pulse 65   Ht 5\' 7"  (1.702 m)   Wt 206 lb (93.4 kg)   SpO2 98%   BMI 32.26 kg/m  Physical Exam  Constitutional: He is oriented to person, place, and  time. He appears well-developed and well-nourished. No distress.  HENT:  Head: Normocephalic and atraumatic.  Right Ear: External ear normal.  Left Ear: External ear normal.  Mouth/Throat: Oropharynx is clear and moist. No oropharyngeal exudate.  Eyes: Pupils are equal, round, and reactive to light. Conjunctivae and EOM are normal. Right eye exhibits no discharge. Left eye exhibits no discharge. No scleral icterus.  Neck: Normal range of motion. Neck supple. No JVD present. No tracheal deviation present. No thyromegaly present.  Cardiovascular: Normal  rate, regular rhythm and intact distal pulses.  Exam reveals no gallop and no friction rub.   No murmur heard. Pulmonary/Chest: Effort normal and breath sounds normal. No respiratory distress. He has no wheezes. He has no rales. He exhibits no tenderness.  Abdominal: Soft. Bowel sounds are normal. He exhibits no distension and no mass. There is no tenderness. There is no rebound and no guarding.  Musculoskeletal: Normal range of motion. He exhibits no edema or tenderness.  Lymphadenopathy:    He has no cervical adenopathy.  Neurological: He is alert and oriented to person, place, and time. He has normal reflexes. No cranial nerve deficit. Coordination normal.  Skin: Skin is warm and dry. No rash noted. He is not diaphoretic. No erythema. No pallor.  Psychiatric: He has a normal mood and affect. His behavior is normal. Judgment and thought content normal.  Nursing note and vitals reviewed.   Vitals:   04/05/17 0932  BP: 138/78  Pulse: 65  SpO2: 98%  Weight: 206 lb (93.4 kg)  Height: 5\' 7"  (1.702 m)    Estimated body mass index is 32.26 kg/m as calculated from the following:   Height as of this encounter: 5\' 7"  (1.702 m).   Weight as of this encounter: 206 lb (93.4 kg).  :   Impression:   ICD-10-CM   1. Atypical chest pain R07.89   2. Dyspnea and respiratory abnormalities R06.00    R06.89      Plan:  Unusual constellation of symptoms without clear cut explanation. One possibility is diastolic dysfunction. But at this point in time we need to rule out any anterior mediastinal mass. We'll get pulmonary function tests and also CT scan of the chest with contrast. If this is fine then we will proceed to pulmonary stress test  He is agreeable with the plan   Dr. Kalman ShanMurali Paulett Kaufhold, M.D., [facility].C.P Pulmonary and Critical Care Medicine Staff Physician [facility] Pager: [phone], If no answer or between  15:00h - 7:00h: call [phone]  04/05/2017 10:15 AM

## 2017-04-06 ENCOUNTER — Ambulatory Visit (HOSPITAL_BASED_OUTPATIENT_CLINIC_OR_DEPARTMENT_OTHER)
Admission: RE | Admit: 2017-04-06 | Discharge: 2017-04-06 | Disposition: A | Discharge status: Home / Self Care | Payer: No Typology Code available for payment source | Source: Ambulatory Visit | Attending: Internal Medicine | Admitting: Internal Medicine

## 2017-04-06 ENCOUNTER — Encounter (HOSPITAL_BASED_OUTPATIENT_CLINIC_OR_DEPARTMENT_OTHER): Payer: Self-pay

## 2017-04-06 ENCOUNTER — Other Ambulatory Visit (HOSPITAL_BASED_OUTPATIENT_CLINIC_OR_DEPARTMENT_OTHER): Payer: No Typology Code available for payment source

## 2017-04-06 DIAGNOSIS — R079 Chest pain, unspecified: Secondary | ICD-10-CM

## 2017-04-06 MED ORDER — IOPAMIDOL (ISOVUE-300) INJECTION 61%: 100.0000 mL | Freq: Once | INTRAVENOUS | Status: DC | PRN

## 2017-04-07 ENCOUNTER — Encounter (HOSPITAL_BASED_OUTPATIENT_CLINIC_OR_DEPARTMENT_OTHER): Payer: Self-pay

## 2017-04-07 ENCOUNTER — Ambulatory Visit (HOSPITAL_BASED_OUTPATIENT_CLINIC_OR_DEPARTMENT_OTHER)
Admission: RE | Admit: 2017-04-07 | Discharge: 2017-04-07 | Disposition: A | Discharge status: Home / Self Care | Payer: No Typology Code available for payment source | Source: Ambulatory Visit | Attending: Internal Medicine | Admitting: Internal Medicine

## 2017-04-07 ENCOUNTER — Ambulatory Visit (INDEPENDENT_AMBULATORY_CARE_PROVIDER_SITE_OTHER): Payer: No Typology Code available for payment source | Admitting: Internal Medicine

## 2017-04-07 DIAGNOSIS — R0689 Other abnormalities of breathing: Secondary | ICD-10-CM

## 2017-04-07 DIAGNOSIS — R0600 Dyspnea, unspecified: Secondary | ICD-10-CM

## 2017-04-07 DIAGNOSIS — I700 Atherosclerosis of aorta: Secondary | ICD-10-CM | POA: Diagnosis not present

## 2017-04-07 DIAGNOSIS — R079 Chest pain, unspecified: Secondary | ICD-10-CM | POA: Diagnosis present

## 2017-04-07 DIAGNOSIS — R0789 Other chest pain: Secondary | ICD-10-CM | POA: Diagnosis not present

## 2017-04-07 LAB — PULMONARY FUNCTION TEST
DL/VA % pred: 130 %
DL/VA: 5.82 ml/min/mmHg/L
DLCO COR % PRED: 89 %
DLCO UNC % PRED: 87 %
DLCO UNC: 24.9 ml/min/mmHg
DLCO cor: 25.34 ml/min/mmHg
FEF 25-75 POST: 3.22 L/s
FEF 25-75 PRE: 2.06 L/s
FEF2575-%Change-Post: 56 %
FEF2575-%PRED-POST: 109 %
FEF2575-%Pred-Pre: 69 %
FEV1-%CHANGE-POST: 9 %
FEV1-%PRED-POST: 92 %
FEV1-%Pred-Pre: 84 %
FEV1-PRE: 2.48 L
FEV1-Post: 2.73 L
FEV1FVC-%Change-Post: 9 %
FEV1FVC-%PRED-PRE: 96 %
FEV6-%CHANGE-POST: 0 %
FEV6-%PRED-POST: 89 %
FEV6-%Pred-Pre: 88 %
FEV6-POST: 3.2 L
FEV6-Pre: 3.17 L
FEV6FVC-%CHANGE-POST: 0 %
FEV6FVC-%PRED-POST: 103 %
FEV6FVC-%Pred-Pre: 102 %
FVC-%CHANGE-POST: 0 %
FVC-%Pred-Post: 86 %
FVC-%Pred-Pre: 86 %
FVC-Post: 3.2 L
FVC-Pre: 3.2 L
POST FEV1/FVC RATIO: 85 %
PRE FEV1/FVC RATIO: 78 %
Post FEV6/FVC ratio: 100 %
Pre FEV6/FVC Ratio: 99 %
RV % pred: 101 %
RV: 1.94 L
TLC % PRED: 76 %
TLC: 4.88 L

## 2017-04-07 MED ORDER — IOPAMIDOL (ISOVUE-300) INJECTION 61%
100.0000 mL | Freq: Once | INTRAVENOUS | Status: AC | PRN
  Administered 2017-04-07: 80 mL via INTRAVENOUS

## 2017-04-07 NOTE — Progress Notes (Signed)
PFT done today. 

## 2017-04-14 ENCOUNTER — Ambulatory Visit (INDEPENDENT_AMBULATORY_CARE_PROVIDER_SITE_OTHER): Payer: No Typology Code available for payment source

## 2017-04-14 DIAGNOSIS — R002 Palpitations: Secondary | ICD-10-CM

## 2017-04-20 ENCOUNTER — Telehealth: Payer: Self-pay | Admitting: Internal Medicine

## 2017-04-20 DIAGNOSIS — R0789 Other chest pain: Secondary | ICD-10-CM

## 2017-04-20 DIAGNOSIS — R0600 Dyspnea, unspecified: Secondary | ICD-10-CM

## 2017-04-20 DIAGNOSIS — R0689 Other abnormalities of breathing: Principal | ICD-10-CM

## 2017-04-20 NOTE — Telephone Encounter (Signed)
Called pt letting her know the results of her CT scan and PFT and that MR wanted pt to have a CPST bike test. Pt expressed understanding. Will place an order for test to be performed. Scheduled pt for a follow-up on 10/19 with MR. Nothing further needed at this time.

## 2017-04-20 NOTE — Telephone Encounter (Signed)
   Let Ardelia Mems know that ct chest and pft are normal  Plan - get CPST bike test and give followup with me (pls d/w Robynn Pane how to do this)  Thanks  Dr. Kalman Shan, M.D., [facility].C.P Pulmonary and Critical Care Medicine Staff Physician [facility] [place] [facility] Critical [facility] Pager: [phone], If no answer or between  15:00h - 7:00h: call [phone]  04/20/2017 1:58 PM

## 2017-05-05 ENCOUNTER — Encounter (HOSPITAL_BASED_OUTPATIENT_CLINIC_OR_DEPARTMENT_OTHER): Payer: Self-pay | Admitting: *Deleted

## 2017-05-05 ENCOUNTER — Emergency Department (HOSPITAL_BASED_OUTPATIENT_CLINIC_OR_DEPARTMENT_OTHER)
Admission: EM | Admit: 2017-05-05 | Discharge: 2017-05-05 | Disposition: A | Discharge status: Home / Self Care | Payer: No Typology Code available for payment source | Attending: Physician Assistant | Admitting: Physician Assistant

## 2017-05-05 ENCOUNTER — Encounter (HOSPITAL_COMMUNITY): Payer: No Typology Code available for payment source

## 2017-05-05 DIAGNOSIS — F419 Anxiety disorder, unspecified: Secondary | ICD-10-CM | POA: Insufficient documentation

## 2017-05-05 MED ORDER — LORAZEPAM 0.5 MG PO TABS: 0.5000 mg | ORAL_TABLET | Freq: Three times a day (TID) | ORAL | 0 refills | Status: DC | PRN

## 2017-05-05 MED FILL — LORazepam 0.5 MG TABS: 0.5 | 2 days supply | Qty: 5 | Fill #0

## 2017-05-05 NOTE — ED Triage Notes (Signed)
States he feels like he is having an anxiety attack. Symptoms of fast heart beat and anxiety since 6am. Symptoms come and go.

## 2017-05-05 NOTE — ED Notes (Signed)
ED Provider at bedside. 

## 2017-05-05 NOTE — ED Notes (Signed)
Reports increased anxiety in the past few weeks. Reports more anxiety since Monday. Felt it at 6am this morning. Another episode at 1130 at home. Pt A&O x 4, no distress noted upon assessment.

## 2017-05-05 NOTE — Discharge Instructions (Signed)
Please follow-up with your primary care physician tomorrow morning as planned. Please return with any chest pain shortness breath or other concerns.

## 2017-05-05 NOTE — ED Provider Notes (Signed)
[facility] Provider Note   CSN: [id number] Arrival date & time: 05/05/17  1246     History   Chief Complaint No chief complaint on file.   HPI Samuel Munoz is a 51 y.o. male.  HPI  Patient is a 51 year old male presenting with anxiety. Patient has history of anxiety has been seen here before before. He's been seeing someone for talk therapy at [facility]. Recently discharged from her services. Patient had extensive workup by pulmonology and his primary care physician for shortness of breath/anxiety.  Patient often talks to his wife who is able to calm him down however his wife is out of town.   Past Medical History:  Diagnosis Date  . Adjustment disorder   . Anxiety   . Bronchitis     Patient Active Problem List   Diagnosis Date Noted  . Dyspnea and respiratory abnormalities 04/05/2017  . Atypical chest pain 01/12/2017    Past Surgical History:  Procedure Laterality Date  . NO PAST SURGERIES         Home Medications    Prior to Admission medications   Not on File    Family History Family History  Problem Relation Age of Onset  . Breast cancer Mother   . Alcohol abuse Father   . Diabetes Mellitus II Son     Social History Social History  Substance Use Topics  . Smoking status: Never Smoker  . Smokeless tobacco: Never Used  . Alcohol use No     Allergies   Patient has no known allergies.   Review of Systems Review of Systems  Constitutional: Negative for activity change.  Respiratory: Negative for shortness of breath.   Cardiovascular: Negative for chest pain.  Gastrointestinal: Negative for abdominal pain.  Psychiatric/Behavioral: Positive for behavioral problems. The patient is nervous/anxious.      Physical Exam Updated Vital Signs BP 129/69   Pulse 70   Temp 98.3 F (36.8 C) (Oral)   Resp 20   Ht  (1.702 m)   Wt 90.7 kg (200 lb)   SpO2 100%   BMI 31.32 kg/m   Physical Exam  Constitutional: He is oriented to  person, place, and time. He appears well-nourished.  HENT:  Head: Normocephalic.  Eyes: Conjunctivae are normal.  Cardiovascular: Normal rate and regular rhythm.   No murmur heard. Pulmonary/Chest: Effort normal and breath sounds normal. No respiratory distress.  Neurological: He is oriented to person, place, and time.  Skin: Skin is warm and dry. He is not diaphoretic.  Psychiatric: He has a normal mood and affect. His behavior is normal.     ED Treatments / Results  Labs (all labs ordered are listed, but only abnormal results are displayed) Labs Reviewed - No data to display  EKG  EKG Interpretation None       Radiology No results found.  Procedures Procedures (including critical care time)  Medications Ordered in ED Medications - No data to display   Initial Impression / Assessment and Plan / ED Course  I have reviewed the triage vital signs and the nursing notes.  Pertinent labs & imaging results that were available during my care of the patient were reviewed by me and considered in my medical decision making (see chart for details).    Patient is a 51 year old male presenting with anxiety. Patient has history of anxiety has been seen here before before. He's been seeing someone for talk therapy at [facility]. Recently discharged from her services. Patient had extensive  workup by pulmonology and his primary care physician for shortness of breath/anxiety.  Patient often talks to his wife who is able to calm him down however his wife is out of town.  4:13 PM Patient's very well-appearing. Normal vital signs. We will have him follow-up with his primary care physician. He has an appointment tomorrow morning in fact. We'll give him some medications that he can help calm himself down over the course of this evening.  Final Clinical Impressions(s) / ED Diagnoses   Final diagnoses:  None    New Prescriptions New Prescriptions   No medications on file     Abelino Derrick, MD 05/05/17 [phone]

## 2017-05-06 ENCOUNTER — Ambulatory Visit (INDEPENDENT_AMBULATORY_CARE_PROVIDER_SITE_OTHER): Payer: No Typology Code available for payment source | Admitting: Medical

## 2017-05-06 ENCOUNTER — Encounter: Payer: Self-pay | Admitting: Medical

## 2017-05-06 ENCOUNTER — Telehealth: Payer: Self-pay | Admitting: Internal Medicine

## 2017-05-06 VITALS — BP 116/69 | HR 67 | Temp 98.3°F | Resp 16 | Ht 67.0 in | Wt 207.0 lb

## 2017-05-06 DIAGNOSIS — F419 Anxiety disorder, unspecified: Secondary | ICD-10-CM

## 2017-05-06 DIAGNOSIS — R002 Palpitations: Secondary | ICD-10-CM

## 2017-05-06 DIAGNOSIS — R0600 Dyspnea, unspecified: Secondary | ICD-10-CM

## 2017-05-06 NOTE — Patient Instructions (Signed)
For your anxiety I would recommend you try the ativan the ED gave you. Use it as needed/as instructed. We want to see if this stops you anxious symptoms when they occur.  Your ekg was normal yesterday. Do recommend you try to use holter again. They wanted 30 days but at least recommend you use for brief periods at rest and when exercising/walking so cardiologist will have some data to review.   Also please follow through with pulmonologist testing and see counselor.   I will review studies/records specialist sends me.  Follow up in 2 weeks or as needed

## 2017-05-06 NOTE — Telephone Encounter (Signed)
ATC pt, no answer. Left message for pt to call back.  

## 2017-05-06 NOTE — Progress Notes (Signed)
Subjective:    Patient ID: Samuel Munoz, male    DOB: Aug 07, 1965, 51 y.o.   MRN: [id number]  HPI  Pt states he is overall doing a lot better. Much less anxiety and stress. He is having less of his atypical chest pain and short of breath sensation. His cardiac work up was negative and so far pulmonologist work up was negative.(So far but both workups are not yet completed.)   He states monday went on longer walk than usual and then started to feel slight sob. He handled walk well and sob resolved.   Then later that day he got home and felt anxiety coming on.But anxiety left/stopped on Monday.  Then rest of week did well but on Thursday night he was at home resting and started to feel some anxiety. He started to feel his heart beat fast at rest which made him more anxious. When this persisted he decided to go ED. Pt waited 2 hours to be seen. ED did ekg and was normal.   Pt had holter done or attempted. He was wearing the holter for 3 hours but he states it made him felt funny. He states he might put if back on to see if they can get any readings.   Pt pulmonary work up so far was negative.  Pt missed test ordered by pulmonologist yesterday.   Since leaving ED last night he feels ok.  Pt states has seen psychologist and they released him. Pt is going to see Terri/counselor end of this month. He just scheduled that today.   On review he states recently has gone up to 2 days with no symptoms.     Review of Systems  Constitutional: Negative for chills, fatigue and fever.       Asymptomatic at the time of exam today.  HENT: Negative for congestion, drooling, hearing loss, nosebleeds and postnasal drip.   Respiratory: Positive for shortness of breath. Negative for cough, chest tightness and wheezing.        The other day walking around the block.   Cardiovascular: Negative for chest pain and palpitations.       Still get rare palpitations.  Gastrointestinal: Negative for abdominal  pain, nausea and vomiting.  Musculoskeletal: Negative for back pain and gait problem.  Skin: Negative for rash.  Neurological: Negative for dizziness, speech difficulty, weakness, numbness and headaches.  Hematological: Negative for adenopathy. Does not bruise/bleed easily.  Psychiatric/Behavioral: Negative for behavioral problems, confusion, self-injury and sleep disturbance. The patient is nervous/anxious.     Past Medical History:  Diagnosis Date  . Adjustment disorder   . Anxiety   . Bronchitis      Social History   Social History  . Marital status: Married    Spouse name: N/A  . Number of children: N/A  . Years of education: N/A   Occupational History  . Not on file.   Social History Main Topics  . Smoking status: Never Smoker  . Smokeless tobacco: Never Used  . Alcohol use No  . Drug use: No  . Sexual activity: Yes   Other Topics Concern  . Not on file   Social History Narrative  . No narrative on file    Past Surgical History:  Procedure Laterality Date  . NO PAST SURGERIES      Family History  Problem Relation Age of Onset  . Breast cancer Mother   . Alcohol abuse Father   . Diabetes Mellitus II Son  No Known Allergies  Current Outpatient Prescriptions on File Prior to Visit  Medication Sig Dispense Refill  . LORazepam (ATIVAN) 0.5 MG tablet Take 1 tablet (0.5 mg total) by mouth every 8 (eight) hours as needed for anxiety. 5 tablet 0   No current facility-administered medications on file prior to visit.     BP 116/69   Pulse 67   Temp 98.3 F (36.8 C) (Oral)   Resp 16   Ht  (1.702 m)   Wt 207 lb (93.9 kg)   SpO2 100%   BMI 32.42 kg/m       Objective:   Physical Exam  General Mental Status- Alert. General Appearance- Not in acute distress.   Skin General: Color- Normal Color. Moisture- Normal Moisture.  Neck Carotid Arteries- Normal color. Moisture- Normal Moisture. No carotid bruits. No JVD.  Chest and Lung  Exam Auscultation: Breath Sounds:-Normal.  Cardiovascular Auscultation:Rythm- Regular. Murmurs & Other Heart Sounds:Auscultation of the heart reveals- No Murmurs.  .  Neurologic Cranial Nerve exam:- CN III-XII intact(No nystagmus), symmetric smile. Strength:- 5/5 equal and symmetric strength both upper and lower extremities.      Assessment & Plan:  For your anxiety I would recommend you try the ativan the ED gave you. Use it as needed/as instructed. We want to see if this stops you anxious symptoms when they occur.  Your ekg was normal yesterday. Do recommend you try to use holter again. They wanted 30 days but at least recommend you use for brief periods at rest and when exercising/walking so cardiologist will have some data to review.   Also please follow through with pulmonologist testing and see counselor.   I will review studies/records specialist sends me.  Follow up in 2 weeks or as needed

## 2017-05-09 ENCOUNTER — Encounter: Payer: Self-pay | Admitting: Physician Assistant

## 2017-05-09 NOTE — Telephone Encounter (Signed)
ATC pt, no answer. Left message for pt to call back.  

## 2017-05-10 ENCOUNTER — Other Ambulatory Visit: Payer: Self-pay

## 2017-05-10 DIAGNOSIS — R0689 Other abnormalities of breathing: Secondary | ICD-10-CM

## 2017-05-10 DIAGNOSIS — R0789 Other chest pain: Secondary | ICD-10-CM

## 2017-05-10 DIAGNOSIS — R0600 Dyspnea, unspecified: Secondary | ICD-10-CM

## 2017-05-10 NOTE — Telephone Encounter (Signed)
Called pt & gave him ph # for [facility] so he can call & reschedule the appt.  Nothing further needed.

## 2017-05-10 NOTE — Telephone Encounter (Signed)
Spoke with pt. He was scheduled for cardiopulmonary stress test on 05/05/17. Pt no showed this appointment but would like to have it rescheduled. Will route message to [facility] to have this scheduled.

## 2017-05-16 ENCOUNTER — Other Ambulatory Visit: Payer: Self-pay

## 2017-05-16 DIAGNOSIS — R0789 Other chest pain: Secondary | ICD-10-CM

## 2017-05-16 DIAGNOSIS — R0600 Dyspnea, unspecified: Secondary | ICD-10-CM

## 2017-05-16 DIAGNOSIS — R0689 Other abnormalities of breathing: Secondary | ICD-10-CM

## 2017-05-17 ENCOUNTER — Encounter (HOSPITAL_COMMUNITY): Payer: Self-pay | Admitting: Radiology

## 2017-05-17 ENCOUNTER — Ambulatory Visit (HOSPITAL_COMMUNITY)
Admission: RE | Admit: 2017-05-17 | Discharge: 2017-05-17 | Disposition: A | Discharge status: Home / Self Care | Payer: No Typology Code available for payment source | Source: Ambulatory Visit | Attending: Cardiology | Admitting: Cardiology

## 2017-05-17 ENCOUNTER — Ambulatory Visit (HOSPITAL_COMMUNITY)
Admission: RE | Admit: 2017-05-17 | Discharge: 2017-05-17 | Disposition: A | Discharge status: Home / Self Care | Payer: No Typology Code available for payment source | Source: Ambulatory Visit | Attending: Physician Assistant | Admitting: Physician Assistant

## 2017-05-17 ENCOUNTER — Ambulatory Visit (HOSPITAL_COMMUNITY): Admission: RE | Admit: 2017-05-17 | Payer: No Typology Code available for payment source | Source: Ambulatory Visit

## 2017-05-17 ENCOUNTER — Other Ambulatory Visit: Payer: Self-pay | Admitting: Cardiology

## 2017-05-17 DIAGNOSIS — R0789 Other chest pain: Secondary | ICD-10-CM

## 2017-05-17 DIAGNOSIS — I878 Other specified disorders of veins: Secondary | ICD-10-CM | POA: Insufficient documentation

## 2017-05-17 DIAGNOSIS — R079 Chest pain, unspecified: Secondary | ICD-10-CM | POA: Diagnosis not present

## 2017-05-17 HISTORY — PX: IR RADIOLOGY PERIPHERAL GUIDED IV START: IMG5598

## 2017-05-17 HISTORY — PX: IR US GUIDE VASC ACCESS RIGHT: IMG2390

## 2017-05-17 LAB — BASIC METABOLIC PANEL
BUN/Creatinine Ratio: 9 (ref 9–20)
BUN: 10 mg/dL (ref 6–24)
CALCIUM: 9.8 mg/dL (ref 8.7–10.2)
CHLORIDE: 108 mmol/L — AB (ref 96–106)
CO2: 18 mmol/L — AB (ref 20–29)
Creatinine, Ser: 1.15 mg/dL (ref 0.76–1.27)
GFR calc Af Amer: 84 mL/min/{1.73_m2} (ref >59)
GFR, EST NON AFRICAN AMERICAN: 73 mL/min/{1.73_m2} (ref >59)
Glucose: 96 mg/dL (ref 65–99)
POTASSIUM: 4.5 mmol/L (ref 3.5–5.2)
SODIUM: 146 mmol/L — AB (ref 134–144)

## 2017-05-17 MED ORDER — METOPROLOL TARTRATE 5 MG/5ML IV SOLN
INTRAVENOUS | Status: AC
  Filled 2017-05-17: qty 5

## 2017-05-17 MED ORDER — IOPAMIDOL (ISOVUE-370) INJECTION 76%
INTRAVENOUS | Status: AC
  Administered 2017-05-17: 100 mL
  Filled 2017-05-17: qty 100

## 2017-05-17 MED ORDER — METOPROLOL TARTRATE 5 MG/5ML IV SOLN
INTRAVENOUS | Status: AC
  Filled 2017-05-17: qty 15

## 2017-05-17 MED ORDER — LIDOCAINE 2% (20 MG/ML) 5 ML SYRINGE
INTRAMUSCULAR | Status: AC
  Filled 2017-05-17: qty 10

## 2017-05-17 MED ORDER — NITROGLYCERIN 0.4 MG SL SUBL
SUBLINGUAL_TABLET | SUBLINGUAL | Status: AC
  Filled 2017-05-17: qty 1

## 2017-05-17 NOTE — Procedures (Signed)
Successful US guided placement of Micropuncture IV catheter to (R)basilic vein. Aspirates/flushes well. No complications. Ok to proceed with CT heart.   Brayton El PA-C Interventional Radiology 05/17/2017 3:26 PM

## 2017-05-17 NOTE — Progress Notes (Signed)
Kidney function stable. Potassium normal. Sodium borderline high

## 2017-05-18 ENCOUNTER — Encounter (HOSPITAL_COMMUNITY): Payer: No Typology Code available for payment source

## 2017-05-18 NOTE — Progress Notes (Signed)
No blockage seen at all, not even any plaques, does not need any further evaluation for any chest pain.

## 2017-05-19 ENCOUNTER — Encounter (HOSPITAL_COMMUNITY): Payer: Self-pay | Admitting: *Deleted

## 2017-05-19 NOTE — Progress Notes (Addendum)
Patient no showed for CPX appointment on 10/17. I personally contacted patient to provide direct contact and clarification to reschedule again. Patient agreeable for rescheduled CPX on 10/31 at 11am. Patient has a follow-up appointment in Dr. Jane Canaryamaswamy's office tomorrow (10/18). I contacted MR to make him aware of these changes requested by the patient. His office will most likely need to reschedule this follow-up from CPX. Patient was advised to keep his appointment with MR tomorrow unless otherwise instructed by MR office. Instructions were briefed with patient and he was agreeable with plan.    Lesia HausenKristen Laquiesha Piacente, MS, ACSM-RCEP 05/19/2017 12:20 PM   Irving BurtonEmily  Please have him reschedule to see me after CPST Cancel OV 05/20/2017 ASAP  Dr. Kalman ShanMurali Ramaswamy, M.D., [facility]C.P Pulmonary and Critical Care Medicine Staff Physician  System [place] [facility] Pager: [phone], If no answer or between  15:00h - 7:00h: call [phone]  05/20/2017 8:55 AM

## 2017-05-20 ENCOUNTER — Ambulatory Visit (INDEPENDENT_AMBULATORY_CARE_PROVIDER_SITE_OTHER): Payer: No Typology Code available for payment source | Admitting: Medical

## 2017-05-20 ENCOUNTER — Ambulatory Visit: Payer: No Typology Code available for payment source | Admitting: Cardiovascular Disease

## 2017-05-20 ENCOUNTER — Telehealth: Payer: Self-pay | Admitting: Internal Medicine

## 2017-05-20 ENCOUNTER — Ambulatory Visit: Payer: No Typology Code available for payment source | Admitting: Internal Medicine

## 2017-05-20 ENCOUNTER — Encounter: Payer: Self-pay | Admitting: Medical

## 2017-05-20 VITALS — BP 123/70 | HR 70 | Temp 98.3°F | Resp 16 | Ht 67.0 in | Wt 209.2 lb

## 2017-05-20 DIAGNOSIS — F419 Anxiety disorder, unspecified: Secondary | ICD-10-CM | POA: Diagnosis not present

## 2017-05-20 DIAGNOSIS — R0789 Other chest pain: Secondary | ICD-10-CM | POA: Diagnosis not present

## 2017-05-20 MED ORDER — VENLAFAXINE HCL ER 37.5 MG PO CP24: 37.5000 mg | ORAL_CAPSULE | Freq: Every day | ORAL | 0 refills | Status: DC

## 2017-05-20 MED ORDER — DICLOFENAC SODIUM 75 MG PO TBEC: 75.0000 mg | DELAYED_RELEASE_TABLET | Freq: Two times a day (BID) | ORAL | 0 refills | Status: DC

## 2017-05-20 NOTE — Telephone Encounter (Signed)
Irving Burtonmily  Reschedule patient. Cancel 05/20/2017 . Needs to see me after CPST  Dr. Kalman ShanMurali Sameka Bagent, M.D., [facility]P Pulmonary and Critical Care Medicine Staff Physician  System [place] [facility] Pager: [phone], If no answer or between  15:00h - 7:00h: call [phone]  05/20/2017 8:56 AM

## 2017-05-20 NOTE — Patient Instructions (Addendum)
For your apparent anxiety, I will rx effexor low dose and will see if this helps.   Your work up with specialist  were negative. I can put you back to work if you want. We discussed and you wanted me to specify 8 hours days and 40 hours a week.  Update me on how your first day return to work went.  Follow up in 2-3 weeks or as needed

## 2017-05-20 NOTE — Progress Notes (Signed)
Subjective:    Patient ID: Samuel Munoz, male    DOB: [birth year], 51 y.o.   MRN: [id number]  HPI  Pt states his cardiac work up was negative.He finally did use holter monitor and gave readings. He got call from cardiologist and was told symptoms not cardiac.  Pt has not seen the pulmonologist for follow up. Pt had negative CT of chest. He indicated he does not want to follow up with pulmonologist.  He states his anterior chest wall pain are gone now.  He know feels that anxiety was causing and now he thinks not feeling anxious as much. Now he state in past he had anxiety level of 7/10. Now 1/10 level anxiety.   Pt thinks he wants to work on May 30, 2017. He is frame Proofreaderbuilder. He thinks job is stressful. He is considering looking for different medication. He was sent to specialist in order to make sure no cardiac or lung cause of symptoms.   Review of Systems  Constitutional: Negative for chills, fatigue and fever.  Respiratory: Negative for cough, chest tightness, shortness of breath and wheezing.   Cardiovascular: Negative for chest pain and palpitations.       Not having recent pain in chest or sob.  Gastrointestinal: Negative for abdominal pain, diarrhea, nausea and vomiting.  Musculoskeletal: Negative for back pain, myalgias, neck pain and neck stiffness.  Skin: Negative for rash.  Neurological: Negative for dizziness, speech difficulty, weakness, light-headedness, numbness and headaches.  Hematological: Negative for adenopathy. Does not bruise/bleed easily.  Psychiatric/Behavioral: Negative for behavioral problems, confusion, dysphoric mood and sleep disturbance. The patient is nervous/anxious.    Past Medical History:  Diagnosis Date  . Adjustment disorder   . Anxiety   . Bronchitis      Social History   Social History  . Marital status: Married    Spouse name: N/A  . Number of children: N/A  . Years of education: N/A   Occupational History  . Not on file.    Social History Main Topics  . Smoking status: Never Smoker  . Smokeless tobacco: Never Used  . Alcohol use No  . Drug use: No  . Sexual activity: Yes   Other Topics Concern  . Not on file   Social History Narrative  . No narrative on file    Past Surgical History:  Procedure Laterality Date  . IR RADIOLOGY PERIPHERAL GUIDED IV START  05/17/2017  . IR US GUIDE VASC ACCESS RIGHT  05/17/2017  . NO PAST SURGERIES      Family History  Problem Relation Age of Onset  . Breast cancer Mother   . Alcohol abuse Father   . Diabetes Mellitus II Son     No Known Allergies  Current Outpatient Prescriptions on File Prior to Visit  Medication Sig Dispense Refill  . LORazepam (ATIVAN) 0.5 MG tablet Take 1 tablet (0.5 mg total) by mouth every 8 (eight) hours as needed for anxiety. 5 tablet 0   No current facility-administered medications on file prior to visit.     BP 123/70   Pulse 70   Temp 98.3 F (36.8 C) (Oral)   Resp 16   Ht 5\' 7"  (1.702 m)   Wt 209 lb 3.2 oz (94.9 kg)   SpO2 100%   BMI 32.77 kg/m       Objective:   Physical Exam   General Mental Status- Alert. General Appearance- Not in acute distress.   Skin General: Color- Normal Color. Moisture- Normal  Moisture.  Neck Carotid Arteries- Normal color. Moisture- Normal Moisture. No carotid bruits. No JVD.  Chest and Lung Exam Auscultation: Breath Sounds:-Normal.  Cardiovascular Auscultation:Rythm- Regular. Murmurs & Other Heart Sounds:Auscultation of the heart reveals- No Murmurs.  Abdomen Inspection:-Inspeection Normal. Palpation/Percussion:Note:No mass. Palpation and Percussion of the abdomen reveal- Non Tender, Non Distended + BS, no rebound or guarding.  Neurologic Cranial Nerve exam:- CN III-XII intact(No nystagmus), symmetric smile. Strength:- 5/5 equal and symmetric strength both upper and lower extremities.      Assessment & Plan:  For your apparent anxiety, I will rx effexor low dose  and will see if this helps.   Your work up with specialist  were negative. I can put you back to work if you want. We discussed and you wanted me to specify 8 hours days and 40 hours a week.  Update me on how your first day return to work went.  Follow up in 2-3 weeks or as needed

## 2017-05-20 NOTE — Telephone Encounter (Signed)
Called pt regarding him supposed to have an OV today due to him no showing for the CPST. Pt stated to me that the CPST has been rescheduled for 05/31/17. I told pt that when I saw that he had had the CPST, I would call him to reschedule his OV. Nothing further needed.

## 2017-05-25 ENCOUNTER — Ambulatory Visit: Payer: Self-pay | Admitting: Psychology

## 2017-06-01 ENCOUNTER — Encounter (HOSPITAL_COMMUNITY): Payer: No Typology Code available for payment source

## 2017-11-11 IMAGING — CT CT CHEST W/ CM
2 of 3 series · 15 of 36 positions shown, 18 images · IV contrast (iopamidol)
Comparison: Plain film 03/04/2017.  CT 11/22/2001.

CLINICAL DATA: Epigastric and chest pain after exertion. Shortness
of breath. Acute aortic syndrome suspected.

EXAM:
CT CHEST WITH CONTRAST
TECHNIQUE: Multidetector CT imaging of the chest was performed during
intravenous contrast administration.
CONTRAST:  80mL 9KVJBV-XAA IOPAMIDOL (9KVJBV-XAA) INJECTION 61%

[Series 2: axial st · axial · 0.74mm/px · z∈[-317,-65]mm · 12 of 148 slices shown, 15 images]
[im 11/148  mediastinal]
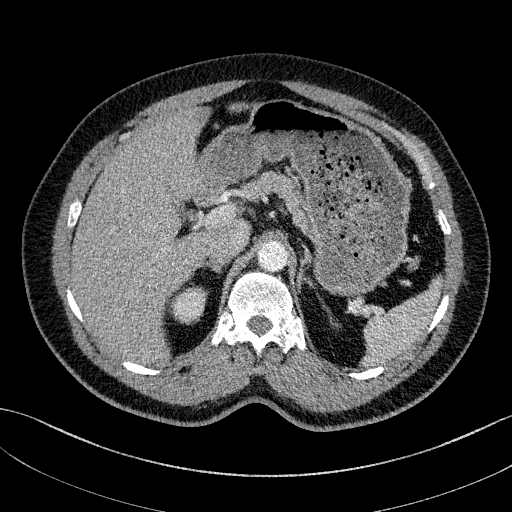
[im 11/148  lung]
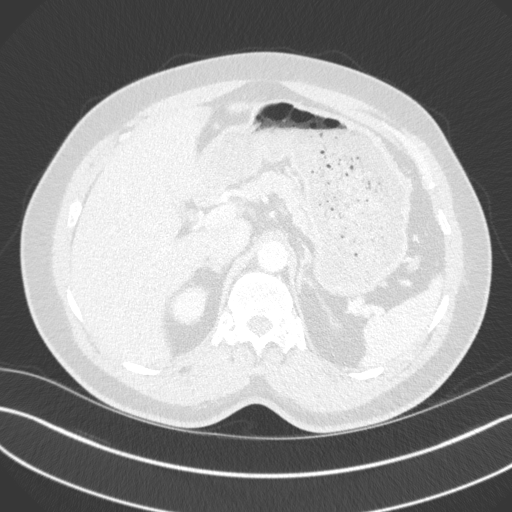
[im 22/148  lung]
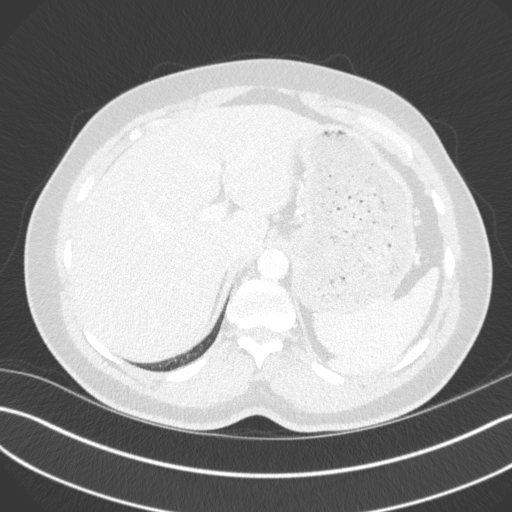
[im 33/148  lung]
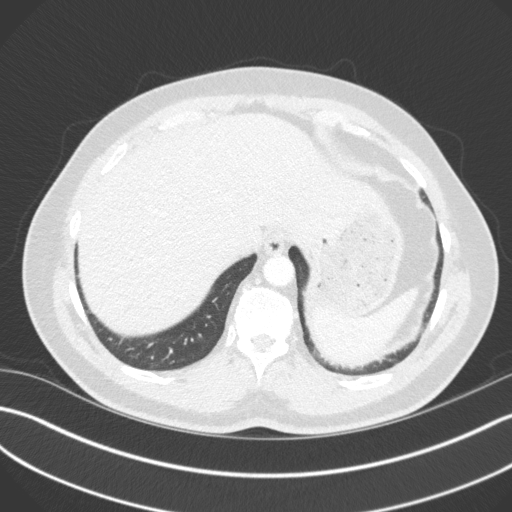
[im 44/148  lung]
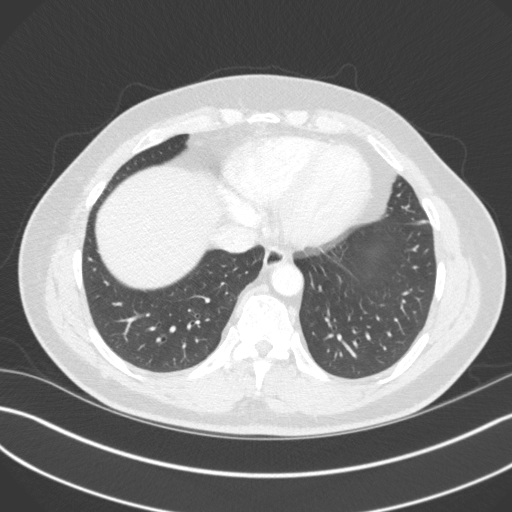
[im 55/148  mediastinal]
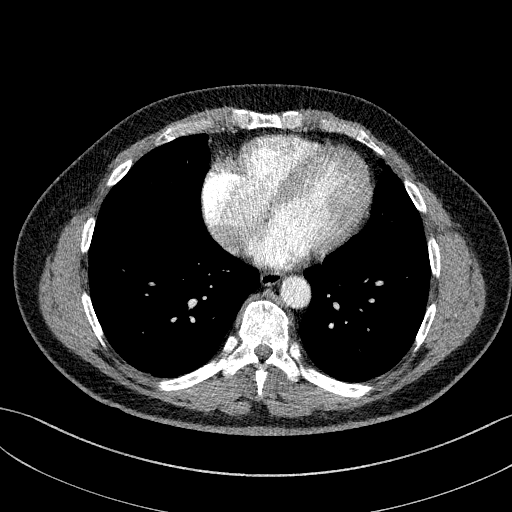
[im 55/148  lung]
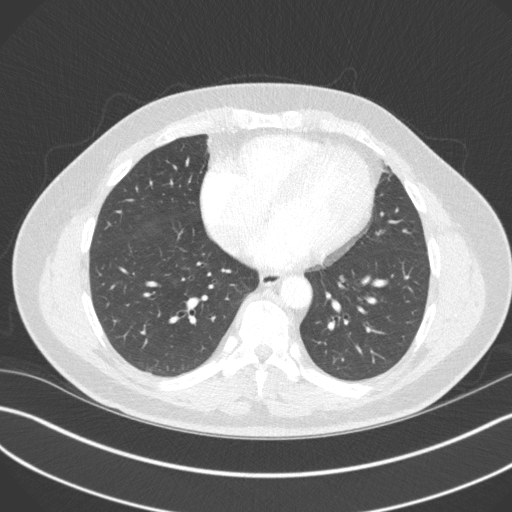
[im 66/148  lung]
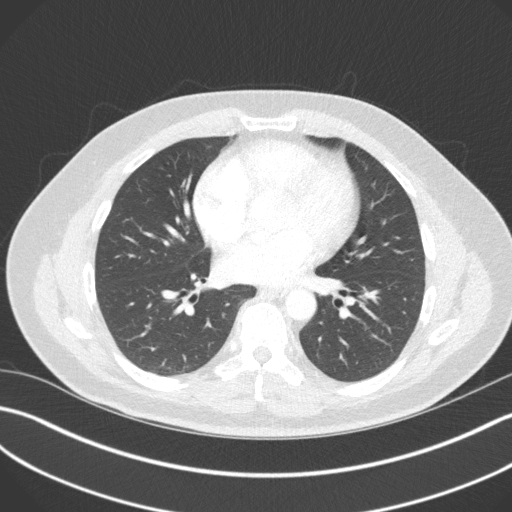
[im 82/148  lung]
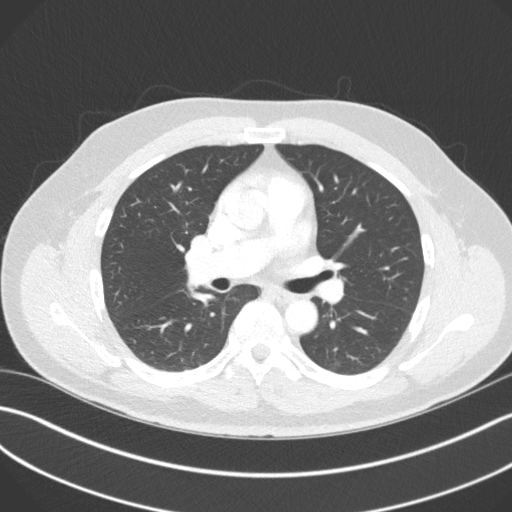
[im 93/148  lung]
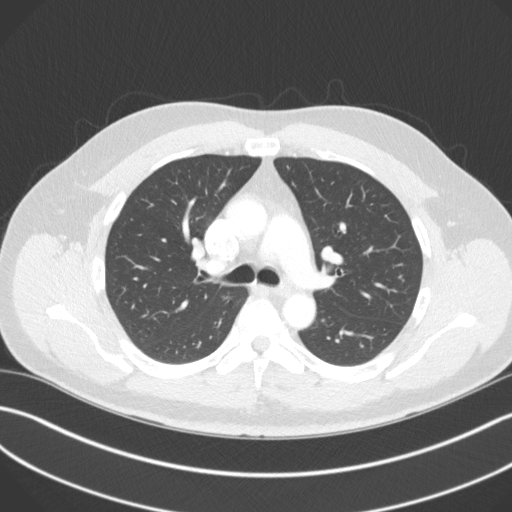
[im 104/148  mediastinal]
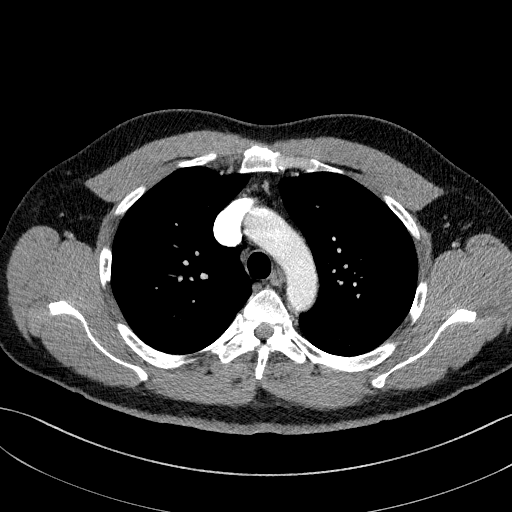
[im 104/148  lung]
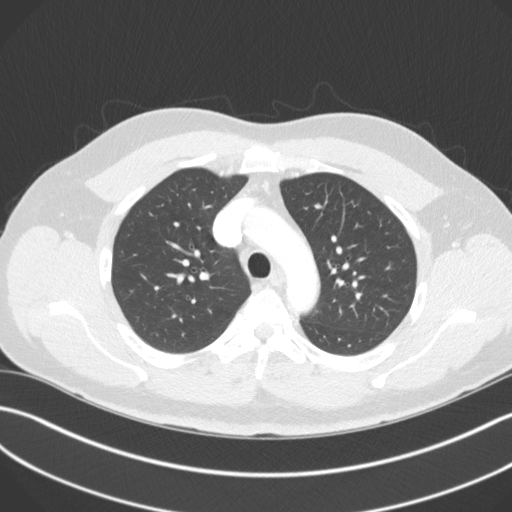
[im 115/148  lung]
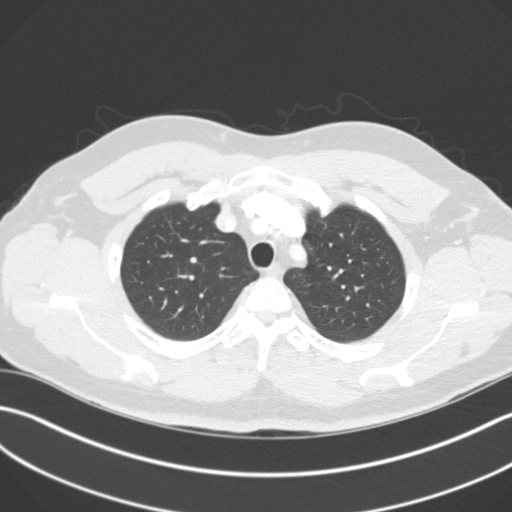
[im 126/148  lung]
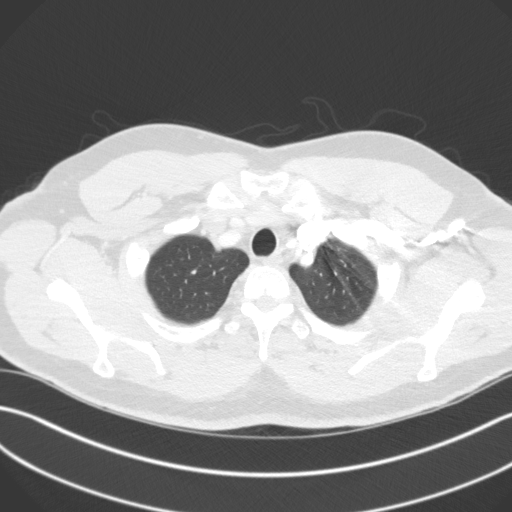
[im 137/148  lung]
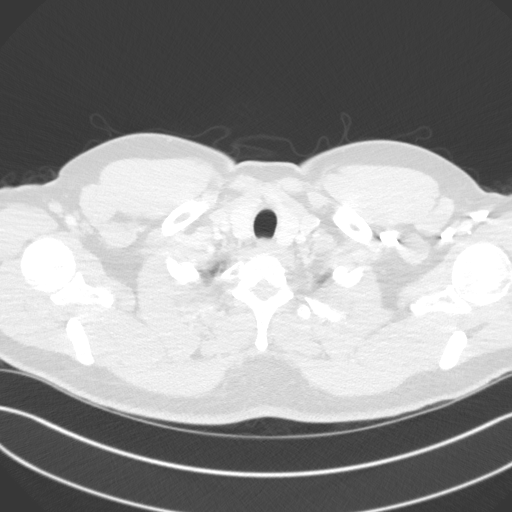

[Series 5: coronal · coronal · 0.61mm/px · 3 of 137 slices shown]
[im 28/137  lung]
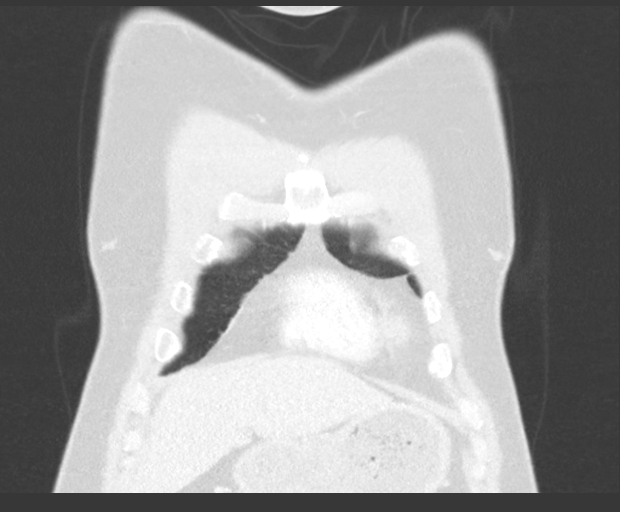
[im 55/137  lung]
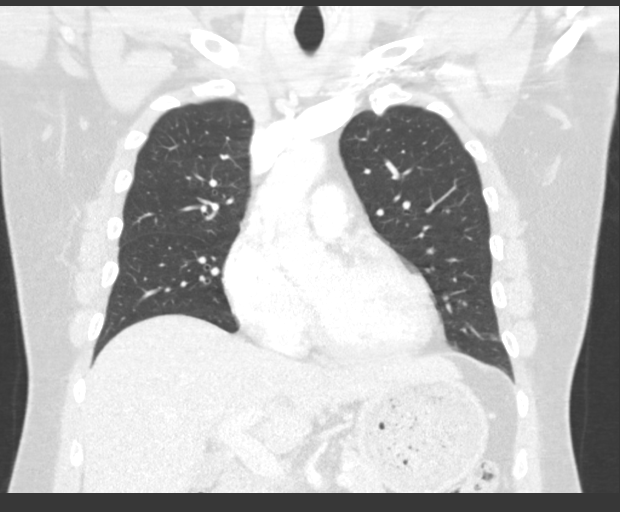
[im 82/137  lung]
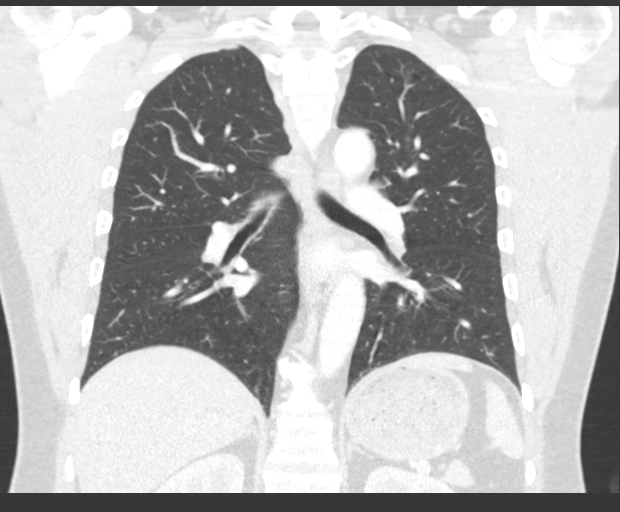

[15 of 36 positions shown; findings below may reference images not displayed]

FINDINGS: Cardiovascular: Normal aortic caliber, without aneurysm or
dissection. Mild aortic and branch vessel calcified atherosclerosis.
Normal heart size, without pericardial effusion. No central
pulmonary embolism, on this non-dedicated study.

Mediastinum/Nodes: No mediastinal or hilar adenopathy.

Lungs/Pleura: No pleural fluid.  Clear lungs.

Upper Abdomen: Normal imaged portions of the liver, spleen, stomach,
pancreas, gallbladder, adrenal glands, kidneys.

Musculoskeletal: Mild thoracic spondylosis. Mild S-shaped thoracic
spine curvature.
IMPRESSION: 1.  No acute process in the chest.
2.  Aortic Atherosclerosis ([id number]).

## 2021-12-01 ENCOUNTER — Encounter (HOSPITAL_BASED_OUTPATIENT_CLINIC_OR_DEPARTMENT_OTHER): Payer: Self-pay | Admitting: Emergency Medicine

## 2021-12-01 ENCOUNTER — Emergency Department (HOSPITAL_BASED_OUTPATIENT_CLINIC_OR_DEPARTMENT_OTHER)
Admission: EM | Admit: 2021-12-01 | Discharge: 2021-12-01 | Disposition: A | Discharge status: Home / Self Care | Payer: No Typology Code available for payment source | Attending: Emergency Medicine | Admitting: Emergency Medicine

## 2021-12-01 ENCOUNTER — Other Ambulatory Visit: Payer: Self-pay

## 2021-12-01 DIAGNOSIS — K4090 Unilateral inguinal hernia, without obstruction or gangrene, not specified as recurrent: Secondary | ICD-10-CM | POA: Insufficient documentation

## 2021-12-01 LAB — URINALYSIS, ROUTINE W REFLEX MICROSCOPIC
Bilirubin Urine: NEGATIVE
Glucose, UA: NEGATIVE mg/dL
Hgb urine dipstick: NEGATIVE
Ketones, ur: NEGATIVE mg/dL
Leukocytes,Ua: NEGATIVE
Nitrite: NEGATIVE
Protein, ur: NEGATIVE mg/dL
Specific Gravity, Urine: 1.02 (ref 1.005–1.030)
pH: 6.5 (ref 5.0–8.0)

## 2021-12-01 NOTE — ED Triage Notes (Signed)
Groin pain/discomfor x 2 week. Twice has seen blood on tesicle onece was 2 weeks ago after using the bathroomthe 2nd time was 1 week ago and noticed blood on underwear. Pt states he did shave around his testicles a few weeks ago but no blood at that time. The groin pain comes and goes and denies any injury. Denies any pain with urination or any blood in urine  ?

## 2021-12-01 NOTE — Discharge Instructions (Signed)
You were diagnosed today with inguinal hernia. If you continue to have symptoms, recommend follow up with PCP. Education sheet on hernia provided. Avoid straining if possible. I recommend ibuprofen for symptoms as needed. If symptoms worsen, recommend prompt follow up ?

## 2021-12-01 NOTE — ED Notes (Signed)
Ambulated to bathroom with steady gait, provided specimen container. ?

## 2021-12-01 NOTE — ED Provider Notes (Signed)
?[facility] EMERGENCY DEPARTMENT ?Provider Note ? ? ?CSN: [id number] ?Arrival date & time: 12/01/21  1128 ? ?  ? ?History ? ?Chief Complaint  ?Patient presents with  ? Groin Pain  ? ? ?Kolt Mcwhirter is a 56 y.o. male.  Patient presents to the emergency department with intermittent right-sided groin/testicle pain.  Patient states symptoms have been ongoing for 2 weeks.  Patient denies new sexual partners.  Patient is in a long-term monogamous relationship with his wife.  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  Patient does endorse worsening of pain with straining.  Denies hematuria, dysuria, testicular tenderness, fever, penile discharge.  Past medical history significant for bronchitis, anxiety, adjustment disorder ? ?HPI ? ?  ? ?Home Medications ?Prior to Admission medications   ?Medication Sig Start Date End Date Taking? Authorizing Provider  ?diclofenac (VOLTAREN) 75 MG EC tablet Take 1 tablet (75 mg total) by mouth 2 (two) times daily. 05/20/17   Saguier, Ramon Dredge, PA-C  ?LORazepam (ATIVAN) 0.5 MG tablet Take 1 tablet (0.5 mg total) by mouth every 8 (eight) hours as needed for anxiety. 05/05/17   Mackuen, Courteney Lyn, MD  ?venlafaxine XR (EFFEXOR-XR) 37.5 MG 24 hr capsule Take 1 capsule (37.5 mg total) by mouth daily with breakfast. 05/20/17   Saguier, Ramon Dredge, PA-C  ?   ? ?Allergies    ?Patient has no known allergies.   ? ?Review of Systems   ?Review of Systems  ?Gastrointestinal:  Negative for abdominal pain.  ?Genitourinary:  Positive for testicular pain. Negative for dysuria, hematuria, penile discharge, penile pain, penile swelling and scrotal swelling.  ? ?Physical Exam ?Updated Vital Signs ?BP (!) 141/78 (BP Location: Right Arm)   Pulse 73   Temp 98.4 ?F (36.9 ?C) (Oral)   Resp 18   Ht 5\' 7"  (1.702 m)   Wt 95.3 kg   SpO2 99%   BMI 32.89 kg/m?  ?Physical Exam ?Vitals and nursing note reviewed. Exam conducted with a chaperone present.  ?Constitutional:   ?   General: He is not in acute  distress. ?HENT:  ?   Head: Normocephalic.  ?Eyes:  ?   Conjunctiva/sclera: Conjunctivae normal.  ?Cardiovascular:  ?   Rate and Rhythm: Normal rate.  ?   Pulses: Normal pulses.  ?Pulmonary:  ?   Effort: Pulmonary effort is normal. No respiratory distress.  ?Abdominal:  ?   Palpations: Abdomen is soft.  ?   Tenderness: There is no abdominal tenderness.  ?   Hernia: A hernia (Possible small, right-sided inguinal hernia) is present. Hernia is present in the right inguinal area. There is no hernia in the left inguinal area.  ?Genitourinary: ?   Penis: Normal and uncircumcised. No tenderness, discharge, swelling or lesions.   ?   Testes: Normal.  ?   Epididymis:  ?   Right: Normal.  ?   Left: Normal.  ?Musculoskeletal:  ?   Cervical back: Normal range of motion.  ?Skin: ?   General: Skin is warm and dry.  ?Neurological:  ?   Mental Status: He is alert and oriented to person, place, and time.  ? ? ?ED Results / Procedures / Treatments   ?Labs ?(all labs ordered are listed, but only abnormal results are displayed) ?Labs Reviewed  ?URINALYSIS, ROUTINE W REFLEX MICROSCOPIC  ? ? ?EKG ?None ? ?Radiology ?No results found. ? ?Procedures ?Procedures  ? ?Medications Ordered in ED ?Medications - No data to display ? ?ED Course/ Medical Decision Making/ A&P ?  ?                        ?  Medical Decision Making ?Amount and/or Complexity of Data Reviewed ?Labs: ordered. ? ? ?Patient presents with concerns of right groin/testicular pain.  Differential includes but is not limited to inguinal hernia, testicular torsion, epididymitis, and others ? ?Personally ordered and interpreted labs.  Urinalysis grossly normal ? ?With the patient's lack of testicular tenderness, small hernia palpable and reducible,no strangulation or incarceration, no pain with palpation of epididymis.  This is likely aggravation from small inguinal hernia.  There is no indication for surgical intervention at this time.  No indication for admission.  The patient may  discharge home.  Education was provided on hernias. ? ?Final Clinical Impression(s) / ED Diagnoses ?Final diagnoses:  ?Unilateral inguinal hernia without obstruction or gangrene, recurrence not specified  ? ? ?Rx / DC Orders ?ED Discharge Orders   ? ? None  ? ?  ? ? ?  ?Darrick Grinder, PA-C ?12/01/21 1306 ? ?  ?Terrilee Files, MD ?12/01/21 1734 ? ?

## 2023-08-05 ENCOUNTER — Emergency Department (HOSPITAL_BASED_OUTPATIENT_CLINIC_OR_DEPARTMENT_OTHER): Payer: Self-pay

## 2023-08-05 ENCOUNTER — Other Ambulatory Visit: Payer: Self-pay

## 2023-08-05 ENCOUNTER — Encounter (HOSPITAL_BASED_OUTPATIENT_CLINIC_OR_DEPARTMENT_OTHER): Payer: Self-pay | Admitting: Emergency Medicine

## 2023-08-05 ENCOUNTER — Emergency Department (HOSPITAL_BASED_OUTPATIENT_CLINIC_OR_DEPARTMENT_OTHER)
Admission: EM | Admit: 2023-08-05 | Discharge: 2023-08-05 | Disposition: A | Discharge status: Home / Self Care | Payer: Self-pay | Attending: Emergency Medicine | Admitting: Emergency Medicine

## 2023-08-05 DIAGNOSIS — R0602 Shortness of breath: Secondary | ICD-10-CM | POA: Insufficient documentation

## 2023-08-05 DIAGNOSIS — R0789 Other chest pain: Secondary | ICD-10-CM | POA: Insufficient documentation

## 2023-08-05 LAB — BASIC METABOLIC PANEL
Anion gap: 5 (ref 5–15)
BUN: 12 mg/dL (ref 6–20)
CO2: 26 mmol/L (ref 22–32)
Calcium: 9.3 mg/dL (ref 8.9–10.3)
Chloride: 104 mmol/L (ref 98–111)
Creatinine, Ser: 1.05 mg/dL (ref 0.61–1.24)
GFR, Estimated: >60 mL/min (ref >60)
Glucose, Bld: 103 mg/dL — ABNORMAL HIGH (ref 70–99)
Potassium: 4 mmol/L (ref 3.5–5.1)
Sodium: 135 mmol/L (ref 135–145)

## 2023-08-05 LAB — CBC
HCT: 40.7 % (ref 39.0–52.0)
Hemoglobin: 14 g/dL (ref 13.0–17.0)
MCH: 30.4 pg (ref 26.0–34.0)
MCHC: 34.4 g/dL (ref 30.0–36.0)
MCV: 88.5 fL (ref 80.0–100.0)
Platelets: 278 10*3/uL (ref 150–400)
RBC: 4.6 MIL/uL (ref 4.22–5.81)
RDW: 12.9 % (ref 11.5–15.5)
WBC: 7.6 10*3/uL (ref 4.0–10.5)
nRBC: 0 % (ref 0.0–0.2)

## 2023-08-05 LAB — TROPONIN I (HIGH SENSITIVITY): Troponin I (High Sensitivity): 2 ng/L (ref <18)

## 2023-08-05 NOTE — ED Triage Notes (Signed)
 Intermittent left chest pain with exertion , reports pain started yesterday  after showering . Adds shortness of breath with chest pain . Denies coughing or URI

## 2023-08-05 NOTE — Discharge Instructions (Signed)
 We evaluated you for your chest pain.  Your symptoms are most likely anxiety related.  I do not think that your pain is related to your heart, but since it has been a few years since you have had any cardiac tests, I would like you to follow-up with a cardiologist.  For your anxiety, you can also follow-up with the [facility].  If you develop any new or worsening symptoms such as severe pain, uncontrolled pain, difficulty breathing, lightheadedness or dizziness, fainting, vomiting, or any other new symptoms, please return to the emergency department for reassessment.

## 2023-08-05 NOTE — ED Provider Notes (Signed)
 [place] [facility]MENT AT [facility] Provider Note  CSN: [id number] Arrival date & time: 08/05/23 1157  Chief Complaint(s) Chest Pain  HPI Samuel Munoz is a 58 y.o. male with history of anxiety presenting to the emergency department with chest pain.  Patient reports chest pain in the left side, sometimes worse with exertion, not pleuritic.  Sometimes has mild shortness of breath when he gets the pain.  He also reports that the pain is worse when he feels anxious and has been feeling more anxious lately.  He reports that he had a similar episode in 2018, had cardiac testing which was negative, ultimately was diagnosed with anxiety and did feel better after seeking care for this.  Has not been following up with anybody for anxiety or seeing anybody lately for anxiety.  Denies any recent travel, surgeries.  No abdominal pain or back pain.  No lightheadedness or dizziness, fainting.  No palpitations.  No nausea or vomiting.   Past Medical History Past Medical History:  Diagnosis Date   Adjustment disorder    Anxiety    Bronchitis    Patient Active Problem List   Diagnosis Date Noted   Dyspnea and respiratory abnormalities 04/05/2017   Atypical chest pain 01/12/2017   Home Medication(s) Prior to Admission medications   Medication Sig Start Date End Date Taking? Authorizing Provider  diclofenac  (VOLTAREN ) 75 MG EC tablet Take 1 tablet (75 mg total) by mouth 2 (two) times daily. 05/20/17   Saguier, [place], PA-C  LORazepam  (ATIVAN ) 0.5 MG tablet Take 1 tablet (0.5 mg total) by mouth every 8 (eight) hours as needed for anxiety. 05/05/17   Mackuen, Courteney Lyn, MD  venlafaxine  XR (EFFEXOR -XR) 37.5 MG 24 hr capsule Take 1 capsule (37.5 mg total) by mouth daily with breakfast. 05/20/17   Saguier, [place] RIGGERS                                                                                                                                    Past Surgical History Past Surgical  History:  Procedure Laterality Date   IR RADIOLOGY PERIPHERAL GUIDED IV START  05/17/2017   IR US  GUIDE VASC ACCESS RIGHT  05/17/2017   NO PAST SURGERIES     Family History Family History  Problem Relation Age of Onset   Breast cancer Mother    Alcohol abuse Father    Diabetes Mellitus II Son     Social History Social History   Tobacco Use   Smoking status: Never   Smokeless tobacco: Never  Vaping Use   Vaping status: Never Used  Substance Use Topics   Alcohol use: No   Drug use: No   Allergies Patient has no known allergies.  Review of Systems Review of Systems  All other systems reviewed and are negative.   Physical Exam Vital Signs  I have reviewed the triage vital signs BP 134/85   Pulse 73  Temp 98 F (36.7 C) (Oral)   Resp 12   Ht 5' 8 (1.727 m)   Wt 94.8 kg   SpO2 100%   BMI 31.78 kg/m  Physical Exam Vitals and nursing note reviewed.  Constitutional:      General: He is not in acute distress.    Appearance: Normal appearance.  HENT:     Mouth/Throat:     Mouth: Mucous membranes are moist.  Eyes:     Conjunctiva/sclera: Conjunctivae normal.  Cardiovascular:     Rate and Rhythm: Normal rate and regular rhythm.  Pulmonary:     Effort: Pulmonary effort is normal. No respiratory distress.     Breath sounds: Normal breath sounds.  Abdominal:     General: Abdomen is flat.     Palpations: Abdomen is soft.     Tenderness: There is no abdominal tenderness.  Musculoskeletal:     Right lower leg: No edema.     Left lower leg: No edema.  Skin:    General: Skin is warm and dry.     Capillary Refill: Capillary refill takes less than 2 seconds.  Neurological:     Mental Status: He is alert and oriented to person, place, and time. Mental status is at baseline.  Psychiatric:        Mood and Affect: Mood normal.        Behavior: Behavior normal.     ED Results and Treatments Labs (all labs ordered are listed, but only abnormal results are  displayed) Labs Reviewed  BASIC METABOLIC PANEL - Abnormal; Notable for the following components:      Result Value   Glucose, Bld 103 (*)    All other components within normal limits  CBC  TROPONIN I (HIGH SENSITIVITY)                                                                                                                          Radiology DG Chest 2 View Result Date: 08/05/2023 CLINICAL DATA:  Chest pain. EXAM: CHEST - 2 VIEW COMPARISON:  03/04/2017. FINDINGS: Bilateral lung fields are clear. Bilateral costophrenic angles are clear. Normal cardio-mediastinal silhouette. No acute osseous abnormalities. The soft tissues are within normal limits. IMPRESSION: No active cardiopulmonary disease. Electronically Signed   By: Ree Molt M.D.   On: 08/05/2023 15:12    Pertinent labs & imaging results that were available during my care of the patient were reviewed by me and considered in my medical decision making (see MDM for details).  Medications Ordered in ED Medications - No data to display  Procedures Procedures  (including critical care time)  Medical Decision Making / ED Course   MDM:  58 year old male presenting to the emergency department chest pain.  Patient well-appearing, physical exam with no focal abnormalities.  EKG is reassuring.  Suspect given history that symptoms are due to the recurrent anxiety.  Low concern for ACS, troponin negative with 3 or 4 days of symptoms, EKG is reassuring.  Had reassuring cardiac testing although this was 7 years ago. Coronary calcium score was 0. Doubt pneumothorax, pneumonia, chest x-ray is clear.  Doubt dissection, symptoms mild and intermittent, chest x-ray clear.  Doubt pulmonary embolism, no tachycardia or hypoxia.  Given 7 years since past cardiac workup, recommend follow-up with cardiologist.  Also  recommended behavioral health urgent care follow-up for anxiety. Will discharge patient to home. All questions answered. Patient comfortable with plan of discharge. Return precautions discussed with patient and specified on the after visit summary.       Additional history obtained: -Additional history obtained from spouse -External records from outside source obtained and reviewed including: Chart review including previous notes, labs, imaging, consultation notes including prior cardiac testing    Lab Tests: -I ordered, reviewed, and interpreted labs.   The pertinent results include:   Labs Reviewed  BASIC METABOLIC PANEL - Abnormal; Notable for the following components:      Result Value   Glucose, Bld 103 (*)    All other components within normal limits  CBC  TROPONIN I (HIGH SENSITIVITY)    Notable for normal troponin   EKG   EKG Interpretation Date/Time:  Friday August 05 2023 12:09:38 EST Ventricular Rate:  70 PR Interval:    QRS Duration:  84 QT Interval:  363 QTC Calculation: 392 R Axis:   52  Text Interpretation: Normal sinus rhythm Confirmed by Francesca Fallow ([id number]) on 08/05/2023 4:23:02 PM         Imaging Studies ordered: I ordered imaging studies including CXR On my interpretation imaging demonstrates no acute process I independently visualized and interpreted imaging. I agree with the radiologist interpretation   Medicines ordered and prescription drug management: No orders of the defined types were placed in this encounter.   -I have reviewed the patients home medicines and have made adjustments as needed   Social Determinants of Health:  Diagnosis or treatment significantly limited by social determinants of health: obesity   Reevaluation: After the interventions noted above, I reevaluated the patient and found that their symptoms have improved  Co morbidities that complicate the patient evaluation  Past Medical History:  Diagnosis Date    Adjustment disorder    Anxiety    Bronchitis       Dispostion: Disposition decision including need for hospitalization was considered, and patient discharged from emergency department.    Final Clinical Impression(s) / ED Diagnoses Final diagnoses:  Atypical chest pain     This chart was dictated using voice recognition software.  Despite best efforts to proofread,  errors can occur which can change the documentation meaning.    Francesca Fallow CROME, MD 08/05/23 [phone]

## 2023-08-05 NOTE — ED Notes (Signed)
 ED Provider at bedside.

## 2023-09-19 ENCOUNTER — Encounter: Payer: Self-pay | Admitting: Cardiology

## 2023-09-19 DIAGNOSIS — F4320 Adjustment disorder, unspecified: Secondary | ICD-10-CM | POA: Insufficient documentation

## 2023-09-19 DIAGNOSIS — F419 Anxiety disorder, unspecified: Secondary | ICD-10-CM | POA: Insufficient documentation

## 2023-09-19 DIAGNOSIS — J40 Bronchitis, not specified as acute or chronic: Secondary | ICD-10-CM | POA: Insufficient documentation

## 2023-09-21 ENCOUNTER — Ambulatory Visit: Payer: Self-pay | Attending: Cardiology | Admitting: Cardiology

## 2023-09-21 ENCOUNTER — Encounter: Payer: Self-pay | Admitting: Cardiology

## 2023-09-21 VITALS — BP 150/94 | HR 73 | Ht 68.0 in | Wt 211.4 lb

## 2023-09-21 DIAGNOSIS — F419 Anxiety disorder, unspecified: Secondary | ICD-10-CM | POA: Diagnosis not present

## 2023-09-21 DIAGNOSIS — R0789 Other chest pain: Secondary | ICD-10-CM | POA: Diagnosis not present

## 2023-09-21 DIAGNOSIS — E66811 Obesity, class 1: Secondary | ICD-10-CM | POA: Insufficient documentation

## 2023-09-21 DIAGNOSIS — J40 Bronchitis, not specified as acute or chronic: Secondary | ICD-10-CM | POA: Diagnosis not present

## 2023-09-21 NOTE — Patient Instructions (Signed)
Medication Instructions:  Your physician recommends that you continue on your current medications as directed. Please refer to the Current Medication list given to you today.  *If you need a refill on your cardiac medications before your next appointment, please call your pharmacy*   Lab Work: None ordered If you have labs (blood work) drawn today and your tests are completely normal, you will receive your results only by: MyChart Message (if you have MyChart) OR A paper copy in the mail If you have any lab test that is abnormal or we need to change your treatment, we will call you to review the results.   Testing/Procedures:  Stress Echocardiogram Instructions:    1. You may take all of your medications.  2. No food, drink or tobacco products four hours prior to your test.  3. Dress prepared to exercise. Best to wear 2 piece outfit and tennis shoes. Shoes must be closed toe.  4. Please bring all current prescription medications.  Follow-Up: At [facility], you and your health needs are our priority.  As part of our continuing mission to provide you with exceptional heart care, we have created designated Provider Care Teams.  These Care Teams include your primary Cardiologist (physician) and Advanced Practice Providers (APPs -  Physician Assistants and Nurse Practitioners) who all work together to provide you with the care you need, when you need it.  We recommend signing up for the patient portal called "MyChart".  Sign up information is provided on this After Visit Summary.  MyChart is used to connect with patients for Virtual Visits (Telemedicine).  Patients are able to view lab/test results, encounter notes, upcoming appointments, etc.  Non-urgent messages can be sent to your provider as well.   To learn more about what you can do with MyChart, go to [email or web address].    Your next appointment:   9 month(s)  The format for your next appointment:   In  Person  Provider:   Belva Crome, MD   Other Instructions Exercise Stress Echocardiogram An exercise stress echocardiogram is a test to check how well your heart is working. This test uses sound waves and a computer to make pictures of your heart. These pictures will be taken before and after you exercise. For this test, you will walk on a treadmill or ride a bicycle to make your heart beat faster. While you exercise, your heart will be checked with an electrocardiogram (ECG). Your blood pressure will also be checked. You may have this test if: You have chest pain or a heart problem. You had a heart attack or heart surgery not long ago. You have heart valve problems. You have a condition that causes narrowing of the blood vessels that supply your heart. You have a high risk of heart disease and: You are starting a new exercise program. You need to have a big surgery. Tell a doctor about: Any allergies you have. All medicines you are taking. This includes vitamins, herbs, eye drops, creams, and over-the-counter medicines. Any problems you or family members have had with medicines that make you fall asleep (anesthetic medicines). Any surgeries you have had. Any blood disorders you have. Any medical conditions you have. Whether you are pregnant or may be pregnant. What are the risks? Generally, this is a safe test. However, problems may occur, including: Chest pain. Feeling dizzy or light-headed. Shortness of breath. Increased or irregular heartbeat. Feeling like you may vomit (nausea) or vomiting. Heart attack. This is very rare. What happens  before the test? Medicines Ask your doctor about changing or stopping your normal medicines. This is important if you take diabetes medicines or blood thinners. If you use an inhaler, bring it to the test. General instructions Wear comfortable clothes and walking shoes. Follow instructions from your doctor about what you cannot eat or  drink before the test. Do not drink or eat anything that has caffeine in it. Stop having caffeine 24 hours before the test. Do not smoke or use products that contain nicotine or tobacco for 4 hours before the test. If you need help quitting, ask your doctor. What happens during the test?  You will take off your clothes from the waist up and put on a hospital gown. Electrodes or patches will be put on your chest. A blood pressure cuff will be put on your arm. Before you exercise, a computer will make a picture of your heart. To do this: You will lie down and a gel will be put on your chest. A wand will be moved over the gel. Sound waves from the wand will go to the computer to make the picture. Then, you will start to exercise. You may walk on a treadmill or pedal a bicycle. Your blood pressure and heart rhythm will be checked while you exercise. The exercise will get harder or faster. You will exercise until: Your heart reaches a certain level. You are too tired to go on. You cannot go on because of chest pain, weakness, or dizziness. You will lie down right away so another picture of your heart can be taken. The procedure may vary among doctors and hospitals. What can I expect after the test? After your test, it is common to have: Mild soreness. Mild tiredness. Your heart rate and blood pressure will be checked until they return to your normal levels. You should not have any new symptoms after this test. Follow these instructions at home: If your doctor says that you can, you may: Eat what you normally eat. Do your normal activities. Take over-the-counter and prescription medicines only as told by your doctor. Keep all follow-up visits. It is up to you to get the results of your test. Ask how to get your results when they are ready. Contact a doctor if: You feel dizzy or light-headed. You have a fast or irregular heartbeat. You feel like you may vomit or you vomit. You have a  headache. You feel short of breath. Get help right away if: You develop pain or pressure: In your chest. In your jaw or neck. Between your shoulders. That goes down your left arm. You faint. You have trouble breathing. These symptoms may be an emergency. Get medical help right away. Call your local emergency services (911 in the [place] Do not wait to see if the symptoms will go away. Do not drive yourself to the hospital. Summary This is a test that checks how well your heart is working. Follow instructions about what you cannot eat or drink before the test. Ask your doctor if you should take your normal medicines before the test. Stop having caffeine 24 hours before the test. Do not smoke or use products with nicotine or tobacco in them for 4 hours before the test. During the test, your blood pressure and heart rhythm will be checked while you exercise. This information is not intended to replace advice given to you by your health care provider. Make sure you discuss any questions you have with your health care provider. Document  Revised: 04/01/2021 Document Reviewed: 03/11/2020 Elsevier Patient Education  2022 [employer].

## 2023-09-21 NOTE — Progress Notes (Signed)
 Cardiology Office Note:    Date:  09/21/2023   ID:  Samuel Munoz, DOB 09/10/1965, MRN [id number]  PCP:  Pcp, No  Cardiologist:  Garwin Brothers, MD   Referring MD: Lonell Grandchild, MD    ASSESSMENT:    1. Anxiety   2. Bronchitis   3. Atypical chest pain   4. Obesity (BMI 30.0-34.9)    PLAN:    In order of problems listed above:  Primary prevention stressed with the patient.  Importance of compliance with diet medication stressed and patient verbalized standing. I reviewed CT coronary angiography report from 2018 and calcium score was 0.  I discussed this with him at length. Chest pain: Atypical in nature.  To reassure him and I set him up for an exercise stress echo he is agreeable. Obesity: Weight reduction stressed diet emphasized and he promises to do better.  Risks of obesity explained.  His lipids are followed by primary care. Elevated blood pressure without diagnosis of hypertension: He appears somewhat anxious today.  I told him to keep a track of his blood pressures at home and discussed this with primary care.  Lifestyle modification, salt intake issues were discussed and questions were answered to satisfaction. Patient will be seen in follow-up appointment in 6 months or earlier if the patient has any concerns.    Medication Adjustments/Labs and Tests Ordered: Current medicines are reviewed at length with the patient today.  Concerns regarding medicines are outlined above.  Orders Placed This Encounter  Procedures   EKG 12-Lead   No orders of the defined types were placed in this encounter.    History of Present Illness:    Samuel Munoz is a 58 y.o. male who is being seen today for the evaluation of chest pain at the request of Scheving, Jerilee Field, MD. patient is a pleasant 58 year old male.  He has no significant past medical history.  He mentions to me that occasionally he has chest discomfort more like soreness in the chest.  This is not related to  exertion.  He leads a sedentary lifestyle.  No radiation to the neck or to the arms.  At the time of my evaluation, the patient is alert awake oriented and in no distress.  Past Medical History:  Diagnosis Date   Adjustment disorder    Anxiety    Atypical chest pain 01/12/2017   Atypical chest pain     Bronchitis    Dyspnea and respiratory abnormalities 04/05/2017    Past Surgical History:  Procedure Laterality Date   IR RADIOLOGY PERIPHERAL GUIDED IV START  05/17/2017   IR US GUIDE VASC ACCESS RIGHT  05/17/2017   NO PAST SURGERIES      Current Medications: No outpatient medications have been marked as taking for the 09/21/23 encounter (Office Visit) with Aaleyah Witherow, Aundra Dubin, MD.     Allergies:   Patient has no known allergies.   Social History   Socioeconomic History   Marital status: Married    Spouse name: Not on file   Number of children: Not on file   Years of education: Not on file   Highest education level: Not on file  Occupational History   Not on file  Tobacco Use   Smoking status: Never   Smokeless tobacco: Never  Vaping Use   Vaping status: Never Used  Substance and Sexual Activity   Alcohol use: No   Drug use: No   Sexual activity: Yes  Other Topics Concern   Not  on file  Social History Narrative   Not on file   Social Drivers of Health   Financial Resource Strain: Not on file  Food Insecurity: Not on file  Transportation Needs: Not on file  Physical Activity: Not on file  Stress: Not on file (06/09/2023)  Social Connections: Unknown (12/12/2021)   Received from [facility]   Social Network    Social Network: Not on file     Family History: The patient's family history includes Alcohol abuse in his father; Breast cancer in his mother; Diabetes Mellitus II in his son.  ROS:   Please see the history of present illness.    All other systems reviewed and are negative.  EKGs/Labs/Other Studies Reviewed:    The following studies were reviewed  today:  EKG Interpretation Date/Time:  Wednesday September 21 2023 11:05:01 EST Ventricular Rate:  73 PR Interval:  212 QRS Duration:  68 QT Interval:  352 QTC Calculation: 387 R Axis:   55  Text Interpretation: Sinus rhythm with 1st degree A-V block When compared with ECG of 05-Aug-2023 12:09, PREVIOUS ECG IS PRESENT Confirmed by Belva Crome [phone]) on 09/21/2023 11:20:25 AM     Recent Labs: 08/05/2023: BUN 12; Creatinine, Ser 1.05; Hemoglobin 14.0; Platelets 278; Potassium 4.0; Sodium 135  Recent Lipid Panel No results found for: "CHOL", "TRIG", "HDL", "CHOLHDL", "VLDL", "LDLCALC", "LDLDIRECT"  Physical Exam:    VS:  BP (!) 150/94   Pulse 73   Ht 5\' 8"  (1.727 m)   Wt 211 lb 6.4 oz (95.9 kg)   SpO2 98%   BMI 32.14 kg/m     Wt Readings from Last 3 Encounters:  09/21/23 211 lb 6.4 oz (95.9 kg)  08/05/23 209 lb (94.8 kg)  12/01/21 210 lb (95.3 kg)     GEN: Patient is in no acute distress HEENT: Normal NECK: No JVD; No carotid bruits LYMPHATICS: No lymphadenopathy CARDIAC: S1 S2 regular, 2/6 systolic murmur at the apex. RESPIRATORY:  Clear to auscultation without rales, wheezing or rhonchi  ABDOMEN: Soft, non-tender, non-distended MUSCULOSKELETAL:  No edema; No deformity  SKIN: Warm and dry NEUROLOGIC:  Alert and oriented x 3 PSYCHIATRIC:  Normal affect    Signed, Garwin Brothers, MD  09/21/2023 11:28 AM    [facility]

## 2023-10-21 ENCOUNTER — Telehealth (HOSPITAL_COMMUNITY): Payer: Self-pay | Admitting: *Deleted

## 2023-10-21 NOTE — Telephone Encounter (Signed)
 Patient given detailed instructions per Stress Test Requisition Sheet for test on 10/24/23 at 2:30.Patient Notified to arrive 30 minutes early, and that it is imperative to arrive on time for appointment to keep from having the test rescheduled.  Patient verbalized understanding. Daneil Dolin

## 2023-10-24 ENCOUNTER — Ambulatory Visit (HOSPITAL_COMMUNITY): Payer: Self-pay

## 2023-10-24 ENCOUNTER — Ambulatory Visit (HOSPITAL_COMMUNITY)
Admission: RE | Admit: 2023-10-24 | Discharge: 2023-10-24 | Disposition: A | Discharge status: Home / Self Care | Payer: Self-pay | Source: Ambulatory Visit | Attending: Cardiology | Admitting: Cardiology

## 2023-10-24 DIAGNOSIS — R0789 Other chest pain: Secondary | ICD-10-CM

## 2023-10-24 MED ORDER — PERFLUTREN LIPID MICROSPHERE
1.0000 mL | INTRAVENOUS | Status: AC | PRN
  Administered 2023-10-24: 3 mL via INTRAVENOUS
